# Patient Record
Sex: Male | Born: 1965 | Race: White | Hispanic: No | Marital: Married | Smoking: Never smoker
Health system: Southern US, Community
[De-identification: ages and names within clinical notes are randomized; demographics above are authoritative.]

## PROBLEM LIST (undated history)

## (undated) DIAGNOSIS — E119 Type 2 diabetes mellitus without complications: Secondary | ICD-10-CM

## (undated) HISTORY — PX: OTHER SURGICAL HISTORY: SHX169

## (undated) HISTORY — DX: Type 2 diabetes mellitus without complications: E11.9

## (undated) NOTE — *Deleted (*Deleted)
     Established patient visit   Patient: Roger Ruiz   DOB: 1965/05/10   47 y.o. Male  MRN: [id number] Visit Date: 01/03/2020  Today's healthcare provider: Megan Mans, MD   No chief complaint on file.  Subjective    HPI  Hyperlipidemia, unspecified hyperlipidemia type Resent Crestor 20 mg qd to pharmacy.  Return to clinic 2 months. - rosuvastatin (CRESTOR) 20 MG tablet; Take 1 tablet (20 mg total) by mouth daily.  Dispense: 90 tablet; Refill: 3  {Show patient history (optional):23778::" "}   Medications: Outpatient Medications Prior to Visit  Medication Sig  . cyclobenzaprine (FLEXERIL) 5 MG tablet One or two at bedtime as needed for calf spasms (Patient not taking: Reported on 07/11/2019)  . FARXIGA 10 MG TABS tablet Take 10 mg by mouth daily.  . Insulin Pen Needle (FIFTY50 PEN NEEDLES) 31G X 5 MM MISC Use as directed for up to 30 days  . meloxicam (MOBIC) 15 MG tablet Take 1 tablet (15 mg total) by mouth daily. (Patient not taking: Reported on 07/11/2019)  . metFORMIN (GLUCOPHAGE) 1000 MG tablet One tablet twice daily with meals  . ONETOUCH DELICA LANCETS 33G MISC   . ONETOUCH VERIO test strip   . rosuvastatin (CRESTOR) 20 MG tablet Take 1 tablet (20 mg total) by mouth daily. (Patient not taking: Reported on 11/09/2019)  . TRESIBA FLEXTOUCH 200 UNIT/ML SOPN    No facility-administered medications prior to visit.    Review of Systems  Constitutional: Negative for appetite change, chills and fever.  Respiratory: Negative for chest tightness, shortness of breath and wheezing.   Cardiovascular: Negative for chest pain and palpitations.  Gastrointestinal: Negative for abdominal pain, nausea and vomiting.    {Heme  Chem  Endocrine  Serology  Results Review (optional):23779::" "}  Objective    There were no vitals taken for this visit. {Show previous vital signs (optional):23777::" "}  Physical Exam  ***  No results found for any visits on 01/03/20.   Assessment & Plan     ***  No follow-ups on file.      {provider attestation***:1}   Megan Mans, MD  [facility] [phone] (phone) [phone] (fax)  [facility]

---

## 2010-01-27 ENCOUNTER — Emergency Department: Payer: Self-pay | Admitting: Emergency Medicine

## 2012-02-23 ENCOUNTER — Ambulatory Visit: Payer: Self-pay | Admitting: Family Medicine

## 2014-09-13 ENCOUNTER — Telehealth: Payer: Self-pay | Admitting: Family Medicine

## 2014-09-13 NOTE — Telephone Encounter (Signed)
Left message to call back. KW °

## 2014-09-13 NOTE — Telephone Encounter (Signed)
Pt is returning call for lab results.  CB#[phone]/MJ

## 2014-09-13 NOTE — Telephone Encounter (Signed)
Pt returning call for lab results.  CB#[phone]/MJ

## 2014-10-11 ENCOUNTER — Telehealth: Payer: Self-pay | Admitting: Family Medicine

## 2014-10-11 NOTE — Telephone Encounter (Signed)
Left message for wife to call back office

## 2014-10-11 NOTE — Telephone Encounter (Signed)
Pt wife, Rutha BouchardDevona called stating she thought pt was suppose to receive a Rx for Metformin.  She states when the a copy of the labs where picked up a note was put on the paper stating "Pharmacy info is needed".  She gave [facility] [address] as the pharmacy to call the Rx in if this is what pt needs to be taking/MJ

## 2014-10-25 ENCOUNTER — Ambulatory Visit (INDEPENDENT_AMBULATORY_CARE_PROVIDER_SITE_OTHER): Payer: BC Managed Care – PPO | Admitting: Family Medicine

## 2014-10-25 ENCOUNTER — Other Ambulatory Visit: Payer: Self-pay | Admitting: Family Medicine

## 2014-10-25 ENCOUNTER — Encounter: Payer: Self-pay | Admitting: Family Medicine

## 2014-10-25 VITALS — BP 108/70 | HR 100 | Temp 98.1°F | Resp 16 | Ht 71.0 in | Wt 181.0 lb

## 2014-10-25 DIAGNOSIS — N529 Male erectile dysfunction, unspecified: Secondary | ICD-10-CM

## 2014-10-25 DIAGNOSIS — M545 Low back pain, unspecified: Secondary | ICD-10-CM | POA: Insufficient documentation

## 2014-10-25 DIAGNOSIS — M25519 Pain in unspecified shoulder: Secondary | ICD-10-CM | POA: Insufficient documentation

## 2014-10-25 DIAGNOSIS — G8929 Other chronic pain: Secondary | ICD-10-CM | POA: Insufficient documentation

## 2014-10-25 DIAGNOSIS — E1169 Type 2 diabetes mellitus with other specified complication: Secondary | ICD-10-CM | POA: Insufficient documentation

## 2014-10-25 DIAGNOSIS — Z794 Long term (current) use of insulin: Secondary | ICD-10-CM | POA: Insufficient documentation

## 2014-10-25 DIAGNOSIS — E1165 Type 2 diabetes mellitus with hyperglycemia: Secondary | ICD-10-CM | POA: Insufficient documentation

## 2014-10-25 DIAGNOSIS — M25559 Pain in unspecified hip: Secondary | ICD-10-CM | POA: Insufficient documentation

## 2014-10-25 DIAGNOSIS — E041 Nontoxic single thyroid nodule: Secondary | ICD-10-CM | POA: Insufficient documentation

## 2014-10-25 DIAGNOSIS — R030 Elevated blood-pressure reading, without diagnosis of hypertension: Secondary | ICD-10-CM

## 2014-10-25 DIAGNOSIS — N521 Erectile dysfunction due to diseases classified elsewhere: Secondary | ICD-10-CM

## 2014-10-25 DIAGNOSIS — E119 Type 2 diabetes mellitus without complications: Secondary | ICD-10-CM | POA: Diagnosis not present

## 2014-10-25 DIAGNOSIS — IMO0001 Reserved for inherently not codable concepts without codable children: Secondary | ICD-10-CM | POA: Insufficient documentation

## 2014-10-25 DIAGNOSIS — E039 Hypothyroidism, unspecified: Secondary | ICD-10-CM

## 2014-10-25 DIAGNOSIS — E049 Nontoxic goiter, unspecified: Secondary | ICD-10-CM | POA: Insufficient documentation

## 2014-10-25 MED ORDER — METFORMIN HCL 500 MG PO TABS: 500.0000 mg | ORAL_TABLET | Freq: Two times a day (BID) | ORAL | Status: DC

## 2014-10-25 NOTE — Progress Notes (Signed)
Subjective:     Patient ID: Roger Ruiz, male   DOB: 11/04/1965, 49 y.o.   MRN: [id number]  HPI  Chief Complaint  Patient presents with  . Abnormal Lab    Patient had fasting labs drawn for his annual physical on 09/08/14. 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 He was lost to f/u.   Review of Systems  Constitutional:       Weight is increased today by 10#  Genitourinary:       Reports erectile dysfunction with minimal erection with attempted intercourse and no nocturnal erections. Also reports having trouble fully emptying his bladder.  Musculoskeletal: Positive for back pain (intermittent stiffness).  Skin:       Brief burning paresthesias of his extremties       Objective:   Physical Exam  Constitutional: He appears well-developed and well-nourished. No distress.       Assessment:    1. Type 2 diabetes mellitus without complication - metFORMIN (GLUCOPHAGE) 500 MG tablet; Take 1 tablet (500 mg total) by mouth 2 (two) times daily with a meal.  Dispense: 60 tablet; Refill: 2  2. Hypothyroidism, unspecified hypothyroidism type - T4, free - TSH  3. Failure of erection-samples of Viagra 50 mg. X 2 Provided. - PSA    Plan:    Return in 4 weeks. Reassured him we will be addressing all of his medical issues with the life threatening ones first.

## 2014-10-25 NOTE — Patient Instructions (Signed)
Start metformin. We will call you with lab results.

## 2014-10-26 LAB — PSA: Prostate Specific Ag, Serum: 0.6 ng/mL (ref 0.0–4.0)

## 2014-10-26 LAB — TSH: TSH: 0.491 u[IU]/mL (ref 0.450–4.500)

## 2014-10-26 LAB — T4, FREE: Free T4: 1.23 ng/dL (ref 0.82–1.77)

## 2014-10-30 ENCOUNTER — Telehealth: Payer: Self-pay

## 2014-10-30 NOTE — Telephone Encounter (Signed)
-----   Message from Anola Gurneyobert Chauvin, [place] sent at 10/30/2014  7:46 AM EDT ----- Thyroid and PSA ok.

## 2014-10-30 NOTE — Telephone Encounter (Signed)
Patient advised.

## 2014-11-22 ENCOUNTER — Encounter: Payer: Self-pay | Admitting: Family Medicine

## 2014-11-22 ENCOUNTER — Ambulatory Visit: Payer: BC Managed Care – PPO | Admitting: Family Medicine

## 2014-11-22 ENCOUNTER — Ambulatory Visit (INDEPENDENT_AMBULATORY_CARE_PROVIDER_SITE_OTHER): Payer: BC Managed Care – PPO | Admitting: Family Medicine

## 2014-11-22 VITALS — BP 108/84 | HR 82 | Temp 97.9°F | Resp 16 | Wt 179.4 lb

## 2014-11-22 DIAGNOSIS — E119 Type 2 diabetes mellitus without complications: Secondary | ICD-10-CM | POA: Diagnosis not present

## 2014-11-22 DIAGNOSIS — R1011 Right upper quadrant pain: Secondary | ICD-10-CM

## 2014-11-22 LAB — GLUCOSE, POCT (MANUAL RESULT ENTRY): POC Glucose: 324 mg/dl — AB (ref 70–99)

## 2014-11-22 MED ORDER — METFORMIN HCL 500 MG PO TABS: 1000.0000 mg | ORAL_TABLET | Freq: Two times a day (BID) | ORAL | Status: DC

## 2014-11-22 MED ORDER — METFORMIN HCL 1000 MG PO TABS: ORAL_TABLET | ORAL | Status: DC

## 2014-11-22 NOTE — Patient Instructions (Signed)
Start metformin 1000 mg. Twice daily.

## 2014-11-22 NOTE — Progress Notes (Signed)
Subjective:     Patient ID: Roger Ruiz, male   DOB: [birth year], 49 y.o.   MRN: [id number]  HPI  Chief Complaint  Patient presents with  . Diabetes    Patient comes in office today for follow up from 7/13. Patient was started on Metformin  BID, patient reports good compliance and tolerance of medication.  States he has had sweet tea today but has not eaten otherwise.   Review of Systems  Gastrointestinal: Positive for abdominal pain (developed RUQ abdominal tenderness/burning last week not related to meals. States he was bending over doing yardwork and feels he may have pulled a muscle. Reports it is getting better this week.).  Genitourinary:       Has not tried Viagra yet but wishes more samples       Objective:   Physical Exam  Constitutional: He appears well-developed and well-nourished. No distress.  Cardiovascular: Normal rate and regular rhythm.   Pulmonary/Chest: Breath sounds normal.  Abdominal: There is tenderness (right upper quadrant. Negative Murphy's sign).       Assessment:    1. Type 2 diabetes mellitus without complication - POCT Glucose (CBG) - metFORMIN (GLUCOPHAGE) 1000 MG tablet; One tablet twice daily with meals  Dispense: 60 tablet; Refill: 1  2. Right upper quadrant pain; ? Musculoskeletal      Plan:    Glucometer and rx for strips provided. Samples of Viagra 100 mg #2.Willl call for abdominal ultrasound if pain does not resolve. Anticipatory guidance regarding screening colonoscopy and labs at next visit.

## 2014-12-29 ENCOUNTER — Other Ambulatory Visit: Payer: Self-pay | Admitting: Family Medicine

## 2014-12-29 ENCOUNTER — Ambulatory Visit
Admission: RE | Admit: 2014-12-29 | Discharge: 2014-12-29 | Disposition: A | Discharge status: Home / Self Care | Payer: BC Managed Care – PPO | Source: Ambulatory Visit | Attending: Family Medicine | Admitting: Family Medicine

## 2014-12-29 DIAGNOSIS — R1011 Right upper quadrant pain: Secondary | ICD-10-CM | POA: Diagnosis present

## 2014-12-29 DIAGNOSIS — R109 Unspecified abdominal pain: Secondary | ICD-10-CM

## 2014-12-29 DIAGNOSIS — K760 Fatty (change of) liver, not elsewhere classified: Secondary | ICD-10-CM | POA: Insufficient documentation

## 2015-01-03 ENCOUNTER — Other Ambulatory Visit: Payer: Self-pay | Admitting: Gastroenterology

## 2015-01-03 DIAGNOSIS — R748 Abnormal levels of other serum enzymes: Secondary | ICD-10-CM

## 2015-01-03 DIAGNOSIS — R1011 Right upper quadrant pain: Secondary | ICD-10-CM

## 2015-01-09 ENCOUNTER — Ambulatory Visit
Admission: RE | Admit: 2015-01-09 | Discharge: 2015-01-09 | Disposition: A | Discharge status: Home / Self Care | Payer: BC Managed Care – PPO | Source: Ambulatory Visit | Attending: Gastroenterology | Admitting: Gastroenterology

## 2015-01-09 DIAGNOSIS — K760 Fatty (change of) liver, not elsewhere classified: Secondary | ICD-10-CM | POA: Diagnosis not present

## 2015-01-09 DIAGNOSIS — R748 Abnormal levels of other serum enzymes: Secondary | ICD-10-CM

## 2015-01-09 DIAGNOSIS — R1011 Right upper quadrant pain: Secondary | ICD-10-CM | POA: Insufficient documentation

## 2015-01-09 MED ORDER — IOHEXOL 300 MG/ML  SOLN
100.0000 mL | Freq: Once | INTRAMUSCULAR | Status: AC | PRN
  Administered 2015-01-09: 100 mL via INTRAVENOUS

## 2015-01-24 ENCOUNTER — Ambulatory Visit (INDEPENDENT_AMBULATORY_CARE_PROVIDER_SITE_OTHER): Payer: BC Managed Care – PPO | Admitting: Family Medicine

## 2015-01-24 ENCOUNTER — Encounter: Payer: Self-pay | Admitting: Family Medicine

## 2015-01-24 VITALS — BP 116/76 | HR 91 | Temp 98.3°F | Resp 16 | Wt 179.0 lb

## 2015-01-24 DIAGNOSIS — E119 Type 2 diabetes mellitus without complications: Secondary | ICD-10-CM

## 2015-01-24 DIAGNOSIS — Z23 Encounter for immunization: Secondary | ICD-10-CM

## 2015-01-24 DIAGNOSIS — K760 Fatty (change of) liver, not elsewhere classified: Secondary | ICD-10-CM | POA: Insufficient documentation

## 2015-01-24 DIAGNOSIS — E1169 Type 2 diabetes mellitus with other specified complication: Secondary | ICD-10-CM

## 2015-01-24 DIAGNOSIS — N521 Erectile dysfunction due to diseases classified elsewhere: Secondary | ICD-10-CM | POA: Diagnosis not present

## 2015-01-24 LAB — POCT GLYCOSYLATED HEMOGLOBIN (HGB A1C): Hemoglobin A1C: 11.3

## 2015-01-24 MED ORDER — VARDENAFIL HCL 10 MG PO TABS: ORAL_TABLET | ORAL | Status: DC

## 2015-01-24 MED ORDER — METFORMIN HCL 1000 MG PO TABS: ORAL_TABLET | ORAL | Status: AC

## 2015-01-24 NOTE — Progress Notes (Signed)
Subjective:     Patient ID: Roger Ruiz, male   DOB: 10/20/1965, 49 y.o.   MRN: [id number]  HPI  Chief Complaint  Patient presents with  . Diabetes    Patient is present in office today for diabetes follow up. Last office visit was 08/10, in house glucose at visit was 324 patient was advised to continue Metfomrin 1000mg . Patient states that most recent glucose was 206 after eating, patient reports that he has cutt out carbonated beverages. Patient reports good compliance, tolerance and symptom control; patient denies any hypoglycemia incidents. Patient is inspecting feet for wounds and sore but he has noticed a discoloration of great toe on both feet.  States he had a negative [facility]up of RUQ [facility]. He was off metformin for a while during the studies. Recommended he return to endocrinologist for evaluation of his diabetes.Reports as above he has made dietary changes eating less potatoes and switched to diet sodas.   Review of Systems  Respiratory: Negative for shortness of breath.   Cardiovascular: Negative for chest pain and palpitations.  Genitourinary:       States Viagra 100 mg. Did not work for him and he wishes to try a different medication.       Objective:   Physical Exam  Constitutional: He appears well-developed and well-nourished. No distress.  Lungs: clear Heart: RRR without murmur Lower extremities: no edema; pedal pulses intact, sensation to monofilament intact, no wounds noted.     Assessment:    1. Type 2 diabetes mellitus without complication, without long-term current use of insulin (HCC): A1C results represent one month on lower dose of metformin - POCT HgB A1C - Ambulatory referral to Endocrinology - metFORMIN (GLUCOPHAGE) 1000 MG tablet; One tablet twice daily with meals  Dispense: 60 tablet; Refill: 2  2. Need for influenza vaccination  3. Erectile dysfunction associated with type 2 diabetes mellitus (HCC) - vardenafil (LEVITRA) 10 MG  tablet; Take one or two as needed daily  Dispense: 10 tablet; Refill: 0    Plan:    f/u with endocrinology.

## 2015-01-24 NOTE — Patient Instructions (Signed)
Continue lifestyle changes and metformin as you are doing and follow up with endocrine

## 2015-08-05 ENCOUNTER — Encounter: Payer: Self-pay | Admitting: Emergency Medicine

## 2015-08-05 ENCOUNTER — Emergency Department
Admission: EM | Admit: 2015-08-05 | Discharge: 2015-08-05 | Disposition: A | Discharge status: Home / Self Care | Payer: BC Managed Care – PPO | Attending: Emergency Medicine | Admitting: Emergency Medicine

## 2015-08-05 DIAGNOSIS — R112 Nausea with vomiting, unspecified: Secondary | ICD-10-CM | POA: Insufficient documentation

## 2015-08-05 DIAGNOSIS — Z7984 Long term (current) use of oral hypoglycemic drugs: Secondary | ICD-10-CM | POA: Diagnosis not present

## 2015-08-05 DIAGNOSIS — Z79899 Other long term (current) drug therapy: Secondary | ICD-10-CM | POA: Diagnosis not present

## 2015-08-05 DIAGNOSIS — E119 Type 2 diabetes mellitus without complications: Secondary | ICD-10-CM | POA: Insufficient documentation

## 2015-08-05 DIAGNOSIS — R1012 Left upper quadrant pain: Secondary | ICD-10-CM | POA: Diagnosis present

## 2015-08-05 LAB — COMPREHENSIVE METABOLIC PANEL
ALT: 15 U/L — ABNORMAL LOW (ref 17–63)
AST: 19 U/L (ref 15–41)
Albumin: 4.5 g/dL (ref 3.5–5.0)
Alkaline Phosphatase: 51 U/L (ref 38–126)
Anion gap: 13 (ref 5–15)
BUN: 20 mg/dL (ref 6–20)
CO2: 23 mmol/L (ref 22–32)
Calcium: 9 mg/dL (ref 8.9–10.3)
Chloride: 104 mmol/L (ref 101–111)
Creatinine, Ser: 0.92 mg/dL (ref 0.61–1.24)
GFR calc Af Amer: >60 mL/min (ref >60)
GFR calc non Af Amer: >60 mL/min (ref >60)
Glucose, Bld: 211 mg/dL — ABNORMAL HIGH (ref 65–99)
Potassium: 3.7 mmol/L (ref 3.5–5.1)
Sodium: 140 mmol/L (ref 135–145)
Total Bilirubin: 1.3 mg/dL — ABNORMAL HIGH (ref 0.3–1.2)
Total Protein: 7.6 g/dL (ref 6.5–8.1)

## 2015-08-05 LAB — FIBRIN DERIVATIVES D-DIMER (ARMC ONLY): Fibrin derivatives D-dimer (ARMC): 252 (ref 0–499)

## 2015-08-05 LAB — CBC
HCT: 49.6 % (ref 40.0–52.0)
Hemoglobin: 16.9 g/dL (ref 13.0–18.0)
MCH: 29.8 pg (ref 26.0–34.0)
MCHC: 34.2 g/dL (ref 32.0–36.0)
MCV: 87.3 fL (ref 80.0–100.0)
Platelets: 222 10*3/uL (ref 150–440)
RBC: 5.68 MIL/uL (ref 4.40–5.90)
RDW: 13.2 % (ref 11.5–14.5)
WBC: 14.2 10*3/uL — ABNORMAL HIGH (ref 3.8–10.6)

## 2015-08-05 LAB — URINALYSIS COMPLETE WITH MICROSCOPIC (ARMC ONLY)
Bacteria, UA: NONE SEEN
Bilirubin Urine: NEGATIVE
Glucose, UA: >500 mg/dL — AB
Hgb urine dipstick: NEGATIVE
Leukocytes, UA: NEGATIVE
Nitrite: NEGATIVE
Protein, ur: NEGATIVE mg/dL
RBC / HPF: NONE SEEN RBC/hpf (ref 0–5)
Specific Gravity, Urine: 1.03 (ref 1.005–1.030)
Squamous Epithelial / LPF: NONE SEEN
WBC, UA: NONE SEEN WBC/hpf (ref 0–5)
pH: 5 (ref 5.0–8.0)

## 2015-08-05 LAB — TROPONIN I: Troponin I: <0.03 ng/mL (ref <0.031)

## 2015-08-05 LAB — LIPASE, BLOOD: Lipase: 65 U/L — ABNORMAL HIGH (ref 11–51)

## 2015-08-05 MED ORDER — SODIUM CHLORIDE 0.9 % IV BOLUS (SEPSIS)
1000.0000 mL | Freq: Once | INTRAVENOUS | Status: AC
  Administered 2015-08-05: 1000 mL via INTRAVENOUS

## 2015-08-05 MED ORDER — RANITIDINE HCL 75 MG PO TABS: 75.0000 mg | ORAL_TABLET | Freq: Two times a day (BID) | ORAL | Status: DC

## 2015-08-05 MED ORDER — ONDANSETRON HCL 4 MG PO TABS: 4.0000 mg | ORAL_TABLET | Freq: Every day | ORAL | Status: DC | PRN

## 2015-08-05 MED ORDER — ONDANSETRON HCL 4 MG/2ML IJ SOLN
4.0000 mg | Freq: Once | INTRAMUSCULAR | Status: AC | PRN
  Administered 2015-08-05: 4 mg via INTRAVENOUS
  Filled 2015-08-05: qty 2

## 2015-08-05 NOTE — ED Notes (Signed)
Patient with left side abd pain, nausea and vomiting that started about 21:00 tonight.

## 2015-08-05 NOTE — Discharge Instructions (Signed)
Abdominal Pain, Adult °Many things can cause abdominal pain. Usually, abdominal pain is not caused by a disease and will improve without treatment. It can often be observed and treated at home. Your health care provider will do a physical exam and possibly order blood tests and X-rays to help determine the seriousness of your pain. However, in many cases, more time must pass before a clear cause of the pain can be found. Before that point, your health care provider may not know if you need more testing or further treatment. °HOME CARE INSTRUCTIONS °Monitor your abdominal pain for any changes. The following actions may help to alleviate any discomfort you are experiencing: °· Only take over-the-counter or prescription medicines as directed by your health care provider. °· Do not take laxatives unless directed to do so by your health care provider. °· Try a clear liquid diet (broth, tea, or water) as directed by your health care provider. Slowly move to a bland diet as tolerated. °SEEK MEDICAL CARE IF: °· You have unexplained abdominal pain. °· You have abdominal pain associated with nausea or diarrhea. °· You have pain when you urinate or have a bowel movement. °· You experience abdominal pain that wakes you in the night. °· You have abdominal pain that is worsened or improved by eating food. °· You have abdominal pain that is worsened with eating fatty foods. °· You have a fever. °SEEK IMMEDIATE MEDICAL CARE IF: °· Your pain does not go away within 2 hours. °· You keep throwing up (vomiting). °· Your pain is felt only in portions of the abdomen, such as the right side or the left lower portion of the abdomen. °· You pass bloody or black tarry stools. °MAKE SURE YOU: °· Understand these instructions. °· Will watch your condition. °· Will get help right away if you are not doing well or get worse. °  °This information is not intended to replace advice given to you by your health care provider. Make sure you discuss  any questions you have with your health care provider. °  °Document Released: 01/08/2005 Document Revised: 12/20/2014 Document Reviewed: 12/08/2012 °Elsevier Interactive Patient Education ©2016 [employer] ° °Nausea and Vomiting °Nausea means you feel sick to your stomach. Throwing up (vomiting) is a reflex where stomach contents come out of your mouth. °HOME CARE  °· Take medicine as told by your doctor. °· Do not force yourself to eat. However, you do need to drink fluids. °· If you feel like eating, eat a normal diet as told by your doctor. °¨ Eat rice, wheat, potatoes, bread, lean meats, yogurt, fruits, and vegetables. °¨ Avoid high-fat foods. °· Drink enough fluids to keep your pee (urine) clear or pale yellow. °· Ask your doctor how to replace body fluid losses (rehydrate). Signs of body fluid loss (dehydration) include: °¨ Feeling very thirsty. °¨ Dry lips and mouth. °¨ Feeling dizzy. °¨ Dark pee. °¨ Peeing less than normal. °¨ Feeling confused. °¨ Fast breathing or heart rate. °GET HELP RIGHT AWAY IF:  °· You have blood in your throw up. °· You have black or bloody poop (stool). °· You have a bad headache or stiff neck. °· You feel confused. °· You have bad belly (abdominal) pain. °· You have chest pain or trouble breathing. °· You do not pee at least once every 8 hours. °· You have cold, clammy skin. °· You keep throwing up after 24 to 48 hours. °· You have a fever. °MAKE SURE YOU:  °·   Understand these instructions. °· Will watch your condition. °· Will get help right away if you are not doing well or get worse. °  °This information is not intended to replace advice given to you by your health care provider. Make sure you discuss any questions you have with your health care provider. °  °Document Released: 09/17/2007 Document Revised: 06/23/2011 Document Reviewed: 08/30/2010 °Elsevier Interactive Patient Education ©2016 Elsevier Inc[employer] ° °

## 2015-08-05 NOTE — ED Provider Notes (Signed)
[facility] Emergency Department Provider Note  ____________________________________________  Time seen: Approximately 730 AM  I have reviewed the triage vital signs and the nursing notes.   HISTORY  Chief Complaint Abdominal Pain; Nausea; and Emesis   HPI Roger Ruiz is a 50 y.o. male with a history of diabetes who is presenting to the emergency department today with left upper quadrant, sharp stabbing pain over the past one half weeks. He said that he also had an episode of vomiting last night and one episode of diarrhea this past Friday. He denies any blood in his vomit or diarrhea. Denies any known sick contacts. Says the pain is sharp and stabbing last week with associated with cough to his left upper quadrant. He points to his left lower thorax when he describes the area of pain. He also says that he had a similar episode this past fall when he had a CAT scan that did not reveal any acute pathology. He denies any pain or nausea at this time. Says that his heart does race when he gets sick.    Past Medical History  Diagnosis Date  . Diabetes mellitus without complication [facility])     Patient Active Problem List   Diagnosis Date Noted  . NAFLD (nonalcoholic fatty liver disease) 16/01/9603  . Arthralgia of hip 10/25/2014  . Chronic LBP 10/25/2014  . Diabetes (HCC) 10/25/2014  . Erectile dysfunction associated with type 2 diabetes mellitus (HCC) 10/25/2014  . Goiter, non-toxic 10/25/2014    Past Surgical History  Procedure Laterality Date  . No surgeries      Current Outpatient Rx  Name  Route  Sig  Dispense  Refill  . dextromethorphan-guaiFENesin (MUCINEX DM) 30-600 MG 12hr tablet   Oral   Take by mouth.         . fluticasone (FLONASE) 50 MCG/ACT nasal spray   Nasal   Place into the nose.         . metFORMIN (GLUCOPHAGE) 1000 MG tablet      One tablet twice daily with meals   60 tablet   2   . vardenafil (LEVITRA) 10 MG tablet       Take one or two as needed daily   10 tablet   0     Allergies Review of patient's allergies indicates no known allergies.  Family History  Problem Relation Age of Onset  . Diverticulitis Mother   . Hypertension Father     Social History Social History  Substance Use Topics  . Smoking status: Never Smoker   . Smokeless tobacco: None  . Alcohol Use: No    Review of Systems Constitutional: No fever/chills Eyes: No visual changes. ENT: No sore throat. Cardiovascular: Denies chest pain. Respiratory: Denies shortness of breath. Gastrointestinal:  No constipation. Genitourinary: Negative for dysuria. Musculoskeletal: Negative for back pain. Skin: Negative for rash. Neurological: Negative for headaches, focal weakness or numbness.  10-point ROS otherwise negative.  ____________________________________________   PHYSICAL EXAM:  VITAL SIGNS: ED Triage Vitals  Enc Vitals Group     BP 08/05/15 0154 106/72 mmHg     Pulse Rate 08/05/15 0154 121     Resp 08/05/15 0154 18     Temp 08/05/15 0154 98.2 F (36.8 C)     Temp Source 08/05/15 0154 Oral     SpO2 08/05/15 0154 99 %     Weight 08/05/15 0154 170 lb (77.111 kg)     Height 08/05/15 0154  (1.753 m)  Head Cir --      Peak Flow --      Pain Score --      Pain Loc --      Pain Edu? --      Excl. in GC? --     Constitutional: Alert and oriented. Well appearing and in no acute distress. Eyes: Conjunctivae are normal. PERRL. EOMI. Head: Atraumatic. Nose: No congestion/rhinnorhea. Mouth/Throat: Mucous membranes are moist.   Neck: No stridor.   Cardiovascular:Tachycardic, regular rhythm. Grossly normal heart sounds.   Respiratory: Normal respiratory effort.  No retractions. Lungs CTAB. Gastrointestinal: Soft and nontender. No distention. No CVA tenderness. Musculoskeletal: No lower extremity tenderness nor edema.  No joint effusions. Neurologic:  Normal speech and language. No gross focal neurologic  deficits are appreciated.  Skin:  Skin is warm, dry and intact. No rash noted. Psychiatric: Mood and affect are normal. Speech and behavior are normal.  ____________________________________________   LABS (all labs ordered are listed, but only abnormal results are displayed)  Labs Reviewed  LIPASE, BLOOD - Abnormal; Notable for the following:    Lipase 65 (*)    All other components within normal limits  COMPREHENSIVE METABOLIC PANEL - Abnormal; Notable for the following:    Glucose, Bld 211 (*)    ALT 15 (*)    Total Bilirubin 1.3 (*)    All other components within normal limits  CBC - Abnormal; Notable for the following:    WBC 14.2 (*)    All other components within normal limits  URINALYSIS COMPLETEWITH MICROSCOPIC ([facility] ONLY) - Abnormal; Notable for the following:    Color, Urine YELLOW (*)    APPearance CLEAR (*)    Glucose, UA >500 (*)    Ketones, ur 2+ (*)    All other components within normal limits  FIBRIN DERIVATIVES D-DIMER ([facility] ONLY)  TROPONIN I   ____________________________________________  EKG  ED ECG REPORT I, Arelia LongestSchaevitz,  Charleen Madera M, the attending physician, personally viewed and interpreted this ECG.   Date: 08/05/2015  EKG Time: 732  Rate: 102  Rhythm: sinus tachycardia  Axis: Normal axis  Intervals:none  ST&T Change: No ST segment elevation or depression. No abnormal T-wave inversion. Machine read as ST elevation in inferior leads. However, I believe this is more due to the morphology of the patient's T waves/ST segments.  ____________________________________________  RADIOLOGY  ____________________________________________   PROCEDURES  ____________________________________________   INITIAL IMPRESSION / ASSESSMENT AND PLAN / ED COURSE  Pertinent labs & imaging results that were available during my care of the patient were reviewed by me and considered in my medical decision making (see chart for  details).  ----------------------------------------- 10:15 AM on 08/05/2015 -----------------------------------------  Patient still tachycardic with a heart rate of 102 but says "I feel fine." He says that his heart rate jumps when he feel sick. He is denying any nausea or pain at this time. I also repeated the abdominal examination he is completely nontender. Nondistended. Possibly viral etiology. The patient does have 2+ ketones in his urine but that may be from dehydration. He is not gapped and his bicarbonate is 23. Frail likely to be DKA. I did discuss with the patient strict return precautions to come back to the emergency department for any worsening symptoms, especially nausea vomiting abdominal pain. He is understanding the plan was to comply. I will discharge him with Zofran as well as an antacid. He'll be following up with his primary care doctor. ____________________________________________   FINAL CLINICAL IMPRESSION(S) / ED  DIAGNOSES  Left upper quadrant abdominal pain. Nausea and vomiting.    Myrna Blazer, MD 08/05/15 1017

## 2015-08-28 ENCOUNTER — Encounter: Payer: Self-pay | Admitting: Family Medicine

## 2015-08-28 ENCOUNTER — Ambulatory Visit (INDEPENDENT_AMBULATORY_CARE_PROVIDER_SITE_OTHER): Payer: BC Managed Care – PPO | Admitting: Family Medicine

## 2015-08-28 VITALS — BP 134/90 | HR 98 | Temp 97.5°F | Resp 16 | Wt 175.0 lb

## 2015-08-28 DIAGNOSIS — R202 Paresthesia of skin: Secondary | ICD-10-CM

## 2015-08-28 DIAGNOSIS — R52 Pain, unspecified: Secondary | ICD-10-CM | POA: Diagnosis not present

## 2015-08-28 MED ORDER — DOXYCYCLINE HYCLATE 100 MG PO TABS: 100.0000 mg | ORAL_TABLET | Freq: Two times a day (BID) | ORAL | Status: DC

## 2015-08-28 MED ORDER — HYDROCODONE-ACETAMINOPHEN 5-325 MG PO TABS: ORAL_TABLET | ORAL | Status: DC

## 2015-08-28 NOTE — Patient Instructions (Signed)
We will call you with the lab results. 

## 2015-08-28 NOTE — Progress Notes (Addendum)
Subjective:     Patient ID: Roger Ruiz, male   DOB: 1965/09/07, 50 y.o.   MRN: [id number]  HPI  Chief Complaint  Patient presents with  . Numbness    Patient comes in office today with concerns of body aches, burning sensation of the skin, insomnia and numbness of fingers and right elbow for the past 48 hrs. Patient reports he has a history of diabetes and was last seen by Endocrinologist in April. Blood sugar readings at home have been within normal limits, most recent reading was 119.  States he does spend time outside and has seen a tick crawling on him on at least one occasion. States body aches and burning sensations are severe enough to keep him from sleeping. Has been taking ibuprofen and Tylenol with little improvement. Reports additional symptoms of sweats but has not documented a fever at home. No cold or respiratory symptoms reported. Accompanied by his wife today.   Review of Systems     Objective:   Physical Exam  Constitutional: He appears well-developed and well-nourished. No distress.  Cardiovascular: Normal rate and regular rhythm.   Pulmonary/Chest: Breath sounds normal.  Musculoskeletal:  Muscle strength in lower extremities 5/5. SLR's to 90 degrees without back pain or radiation of pain. No discomfort when changing positions.       Assessment:    1. Body aches: will cover for tick fever - Comprehensive metabolic panel - Sedimentation rate - CBC with Differential/Platelet - Rocky mtn spotted fvr abs pnl(IgG+IgM) - Lyme Disease, IgM, Early Test w/ Rflx - doxycycline (VIBRA-TABS) 100 MG tablet; Take 1 tablet (100 mg total) by mouth 2 (two) times daily.  Dispense: 14 tablet; Refill: 1 - HYDROcodone-acetaminophen (NORCO/VICODIN) 5-325 MG tablet; One every 4-6 hours as needed for pain  Dispense: 28 tablet; Refill: 0  2. Paresthesias - Comprehensive metabolic panel - Sedimentation rate - CBC with Differential/Platelet - Rocky mtn spotted fvr abs pnl(IgG+IgM) -  Lyme Disease, IgM, Early Test w/ Rflx - doxycycline (VIBRA-TABS) 100 MG tablet; Take 1 tablet (100 mg total) by mouth 2 (two) times daily.  Dispense: 14 tablet; Refill: 1    Plan:    Further f/u pending lab work. Work excuse for 5/16-5/19.

## 2015-08-29 ENCOUNTER — Telehealth: Payer: Self-pay

## 2015-08-29 NOTE — Telephone Encounter (Signed)
-----   Message from Anola Gurneyobert Chauvin, [place] sent at 08/29/2015  9:48 AM EDT ----- Regarding: labs Please add acute hepatitis panel, CMV (cytomegalovirus), and EBV (mono) antibodies to his current blood draw. His labs are partially in and reflect significant liver inflammation. I will call the patient when the CBC is available.

## 2015-08-29 NOTE — Telephone Encounter (Signed)
additional labs have been ordered

## 2015-08-30 LAB — CBC WITH DIFFERENTIAL/PLATELET
Basophils Absolute: 0 10*3/uL (ref 0.0–0.2)
Basos: 0 %
EOS (ABSOLUTE): 0.1 10*3/uL (ref 0.0–0.4)
Eos: 1 %
Hematocrit: 50.2 % (ref 37.5–51.0)
Hemoglobin: 16.8 g/dL (ref 12.6–17.7)
Immature Grans (Abs): 0 10*3/uL (ref 0.0–0.1)
Immature Granulocytes: 0 %
Lymphocytes Absolute: 2.1 10*3/uL (ref 0.7–3.1)
Lymphs: 30 %
MCH: 29.9 pg (ref 26.6–33.0)
MCHC: 33.5 g/dL (ref 31.5–35.7)
MCV: 89 fL (ref 79–97)
Monocytes Absolute: 0.6 10*3/uL (ref 0.1–0.9)
Monocytes: 8 %
Neutrophils Absolute: 4.2 10*3/uL (ref 1.4–7.0)
Neutrophils: 61 %
Platelets: 159 10*3/uL (ref 150–379)
RBC: 5.62 x10E6/uL (ref 4.14–5.80)
RDW: 13.5 % (ref 12.3–15.4)
WBC: 7 10*3/uL (ref 3.4–10.8)

## 2015-08-30 LAB — COMPREHENSIVE METABOLIC PANEL
ALT: 1027 IU/L (ref 0–44)
AST: 334 IU/L — ABNORMAL HIGH (ref 0–40)
Albumin/Globulin Ratio: 1.4 (ref 1.2–2.2)
Albumin: 4.2 g/dL (ref 3.5–5.5)
Alkaline Phosphatase: 233 IU/L — ABNORMAL HIGH (ref 39–117)
BUN/Creatinine Ratio: 12 (ref 9–20)
BUN: 10 mg/dL (ref 6–24)
Bilirubin Total: 1 mg/dL (ref 0.0–1.2)
CO2: 20 mmol/L (ref 18–29)
Calcium: 9.3 mg/dL (ref 8.7–10.2)
Chloride: 97 mmol/L (ref 96–106)
Creatinine, Ser: 0.83 mg/dL (ref 0.76–1.27)
GFR calc Af Amer: 119 mL/min/{1.73_m2} (ref >59)
GFR calc non Af Amer: 103 mL/min/{1.73_m2} (ref >59)
Globulin, Total: 3 g/dL (ref 1.5–4.5)
Glucose: 214 mg/dL — ABNORMAL HIGH (ref 65–99)
Potassium: 4.5 mmol/L (ref 3.5–5.2)
Sodium: 137 mmol/L (ref 134–144)
Total Protein: 7.2 g/dL (ref 6.0–8.5)

## 2015-08-30 LAB — RMSF, IGG, IFA: RMSF, IGG, IFA: <1:64 {titer}

## 2015-08-30 LAB — ROCKY MTN SPOTTED FVR ABS PNL(IGG+IGM)
RMSF IgG: POSITIVE — AB
RMSF IgM: 0.26 index (ref 0.00–0.89)

## 2015-08-30 LAB — SEDIMENTATION RATE: Sed Rate: 2 mm/hr (ref 0–30)

## 2015-08-30 LAB — LYME, IGM, EARLY TEST/REFLEX: LYME DISEASE AB, QUANT, IGM: <0.8 index (ref 0.00–0.79)

## 2015-09-01 LAB — EPSTEIN-BARR VIRUS VCA ANTIBODY PANEL
EBV Early Antigen Ab, IgG: <9 U/mL (ref 0.0–8.9)
EBV NA IgG: >600 U/mL — ABNORMAL HIGH (ref 0.0–17.9)
EBV VCA IgG: >600 U/mL — ABNORMAL HIGH (ref 0.0–17.9)
EBV VCA IgM: <36 U/mL (ref 0.0–35.9)

## 2015-09-01 LAB — HEPATITIS PANEL, ACUTE
Hep A IgM: NEGATIVE
Hep B C IgM: NEGATIVE
Hep C Virus Ab: 0.1 s/co ratio (ref 0.0–0.9)
Hepatitis B Surface Ag: NEGATIVE

## 2015-09-01 LAB — SPECIMEN STATUS REPORT

## 2015-09-01 LAB — CMV IGM: CMV IgM Ser EIA-aCnc: <30 AU/mL (ref 0.0–29.9)

## 2015-09-04 ENCOUNTER — Ambulatory Visit (INDEPENDENT_AMBULATORY_CARE_PROVIDER_SITE_OTHER): Payer: BC Managed Care – PPO | Admitting: Family Medicine

## 2015-09-04 ENCOUNTER — Encounter: Payer: Self-pay | Admitting: Family Medicine

## 2015-09-04 VITALS — BP 120/84 | HR 70 | Temp 97.9°F | Resp 16 | Wt 175.8 lb

## 2015-09-04 DIAGNOSIS — R945 Abnormal results of liver function studies: Principal | ICD-10-CM

## 2015-09-04 DIAGNOSIS — M25511 Pain in right shoulder: Secondary | ICD-10-CM

## 2015-09-04 DIAGNOSIS — G8929 Other chronic pain: Secondary | ICD-10-CM | POA: Diagnosis not present

## 2015-09-04 DIAGNOSIS — R7989 Other specified abnormal findings of blood chemistry: Secondary | ICD-10-CM | POA: Diagnosis not present

## 2015-09-04 NOTE — Patient Instructions (Addendum)
We will call you with the lab results. Discussed use of Benadryl to help sleep.

## 2015-09-04 NOTE — Progress Notes (Signed)
Subjective:     Patient ID: Roger Ruiz, male   DOB: 1965/10/11, 50 y.o.   MRN: [id number]  HPI  Chief Complaint  Patient presents with  . Follow-up    Patient returns back to office today to follow up visit. Patient was last seen in office 08/28/15 and treated with antibiotic to cover tick fever.  Patient reports that he is on his second round of doycycline and he is still having pain causing him difficulty to sleep at night. Patient reports that there is numbness on the back of his right arm and believes that he might have some damage.   . Abnormal Lab    Patient returns to address recent lab report showing elevated LFT  Reports in general feels better but is having pain from old injury in his right shoulder/scapula which is interrupting his sleep. Acute hepatitis screen, CMV, and EBV antibodies were ok.   Review of Systems  Gastrointestinal: Negative for nausea, vomiting and abdominal pain.       Objective:   Physical Exam  Constitutional: He appears well-developed and well-nourished. No distress.  Abdominal: Soft. There is no tenderness. Guarding: vountary due to ticklish.  No hepatomegaly     Assessment:    1. Elevated liver function tests: associated with presumed tick fever  - Hepatic function panel    Plan:    Further f/u pending lab results.

## 2015-09-05 ENCOUNTER — Telehealth: Payer: Self-pay

## 2015-09-05 LAB — HEPATIC FUNCTION PANEL
ALT: 84 IU/L — ABNORMAL HIGH (ref 0–44)
AST: 24 IU/L (ref 0–40)
Albumin: 4.3 g/dL (ref 3.5–5.5)
Alkaline Phosphatase: 125 IU/L — ABNORMAL HIGH (ref 39–117)
Bilirubin Total: 0.6 mg/dL (ref 0.0–1.2)
Bilirubin, Direct: 0.2 mg/dL (ref 0.00–0.40)
Total Protein: 7.1 g/dL (ref 6.0–8.5)

## 2015-09-05 NOTE — Telephone Encounter (Signed)
-----   Message from Anola Gurneyobert Chauvin, [place] sent at 09/05/2015  8:41 AM EDT ----- Liver tests are dramatically improved and near normal. Complete doxycycline as we discussed. Did you want to see the orthopedic doctor (Dr. Martha ClanKrasinski) again about your shoulder?p

## 2015-09-05 NOTE — Telephone Encounter (Signed)
Patient has been advised he states that pain in shoulder has improved, he states that he will call back if pain pain persist. KW

## 2015-11-23 ENCOUNTER — Ambulatory Visit (INDEPENDENT_AMBULATORY_CARE_PROVIDER_SITE_OTHER): Payer: BC Managed Care – PPO | Admitting: Family Medicine

## 2015-11-23 ENCOUNTER — Encounter: Payer: Self-pay | Admitting: Family Medicine

## 2015-11-23 VITALS — BP 116/82 | HR 74 | Temp 98.3°F | Resp 16 | Wt 181.0 lb

## 2015-11-23 DIAGNOSIS — R202 Paresthesia of skin: Secondary | ICD-10-CM

## 2015-11-23 DIAGNOSIS — M7062 Trochanteric bursitis, left hip: Secondary | ICD-10-CM | POA: Diagnosis not present

## 2015-11-23 DIAGNOSIS — M79662 Pain in left lower leg: Secondary | ICD-10-CM | POA: Diagnosis not present

## 2015-11-23 MED ORDER — MELOXICAM 15 MG PO TABS: 15.0000 mg | ORAL_TABLET | Freq: Every day | ORAL | 0 refills | Status: DC

## 2015-11-23 MED ORDER — CYCLOBENZAPRINE HCL 5 MG PO TABS: ORAL_TABLET | ORAL | 0 refills | Status: AC

## 2015-11-23 NOTE — Progress Notes (Signed)
Subjective:     Patient ID: Roger Ruiz, male   DOB: Jul 22, 1965, 50 y.o.   MRN: [id number]  HPI  Chief Complaint  Patient presents with  . Leg Pain    Patient comes in office today with concerns of leg pain on the left side for the past several weeks. Patient reports that around his hip skin is sensitive to the touch, and pain in lower leg is described as a ache.  . Numbness    Patient would like to address numbness and tingling in his forearm radiating down to his fingers intermittent since May  States numbness and tingling are residual from the time he had presumed tick fever treated  in the Spring. Has developed left lateral hip tenderness and a throbbing pain in his left calf particularly at night. No specific injury reported though he does work in his yard. Continues to be followed by endocrine for diabetes and not sure his sugar is controlled well yet.   Review of Systems     Objective:   Physical Exam  Constitutional: He appears well-developed and well-nourished. No distress.  Cardiovascular:  Pulses:      Dorsalis pedis pulses are 2+ on the left side.       Posterior tibial pulses are 2+ on the left side.  Musculoskeletal: He exhibits no edema (left lower extremity).  Mild left calf tenderness just lateral to his tibia but no erythema/rash. Tender over his left trochanteric bursa. SLR's to 90 degrees without discomfort.       Assessment:    1. Trochanteric bursitis of left hip - meloxicam (MOBIC) 15 MG tablet; Take 1 tablet (15 mg total) by mouth daily.  Dispense: 30 tablet; Refill: 0  2. Calf pain, left - meloxicam (MOBIC) 15 MG tablet; Take 1 tablet (15 mg total) by mouth daily.  Dispense: 30 tablet; Refill: 0  3. Paresthesias in right hand/arm; ? Secondary to diabetes ? Post lyme syndrome    Plan:    Consider orthopedic consult.

## 2015-11-23 NOTE — Patient Instructions (Signed)
Let me know if you are not getting better after a week.

## 2016-03-17 IMAGING — CT CT ABDOMEN W/ CM
2 of 5 series · 16 of 46 positions shown, 18 images · IV contrast (omnipaque)
Comparison: None.

CLINICAL DATA: Right upper quadrant abdominal pain for 3 months.
Elevated serum lipase.

EXAM:
CT ABDOMEN WITH CONTRAST
TECHNIQUE: Multidetector CT imaging of the abdomen was performed using the
standard protocol following bolus administration of intravenous
contrast.
CONTRAST:  100mL OMNIPAQUE IOHEXOL 300 MG/ML  SOLN

[Series 4: routine w · axial · 0.71mm/px · z∈[-906,-676]mm · 13 of 54 slices shown, 15 images]
[im 4/54  soft-tissue]
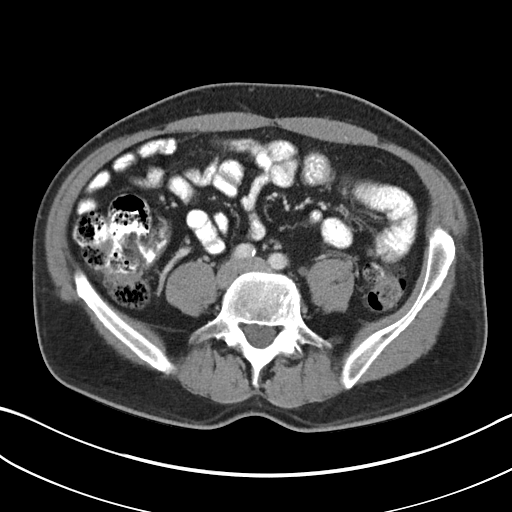
[im 4/54  bone]
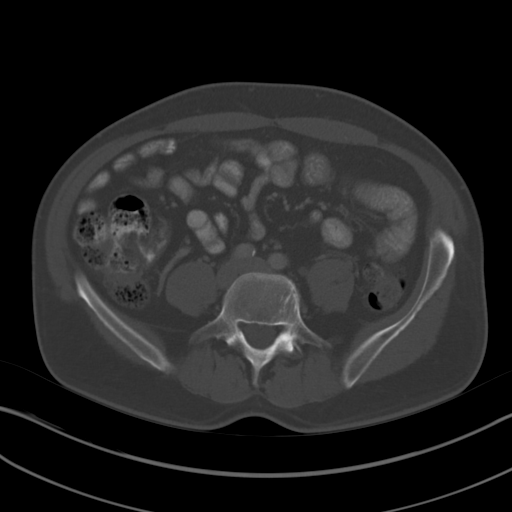
[im 8/54  soft-tissue]
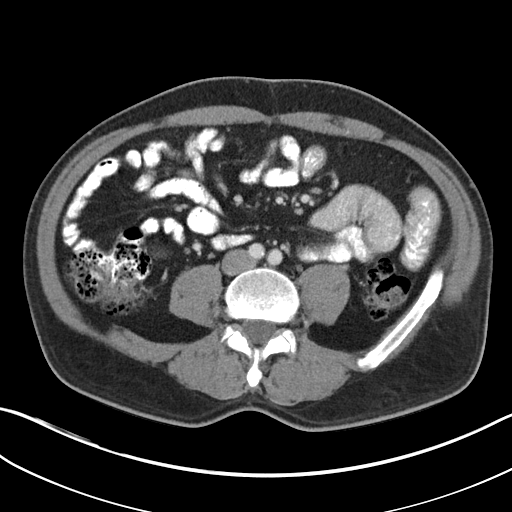
[im 12/54  soft-tissue]
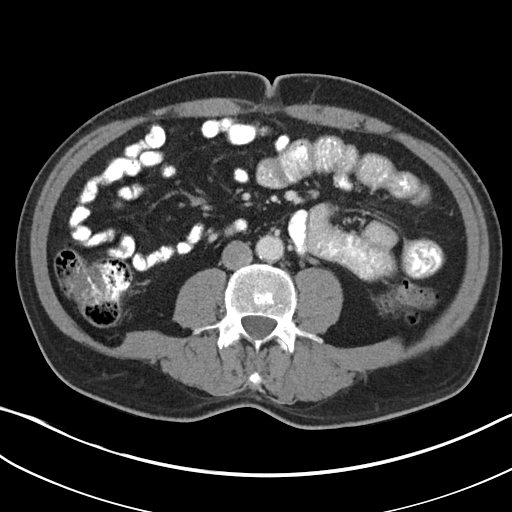
[im 16/54  soft-tissue]
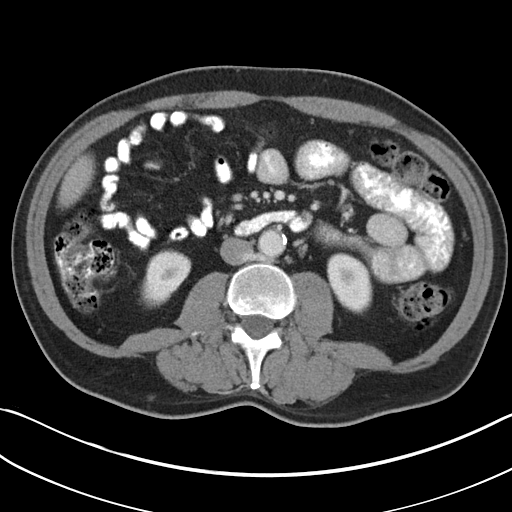
[im 19/54  soft-tissue]
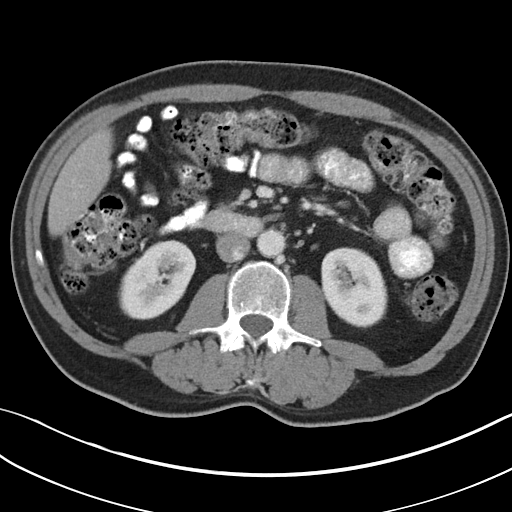
[im 23/54  soft-tissue]
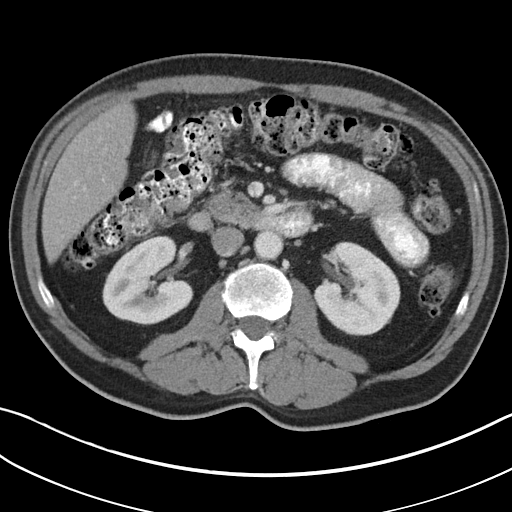
[im 27/54  soft-tissue]
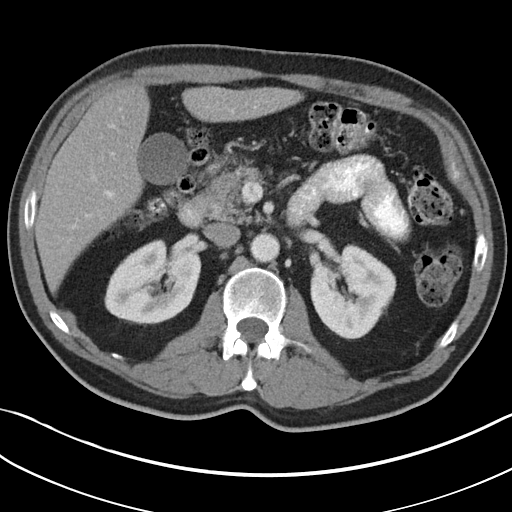
[im 31/54  soft-tissue]
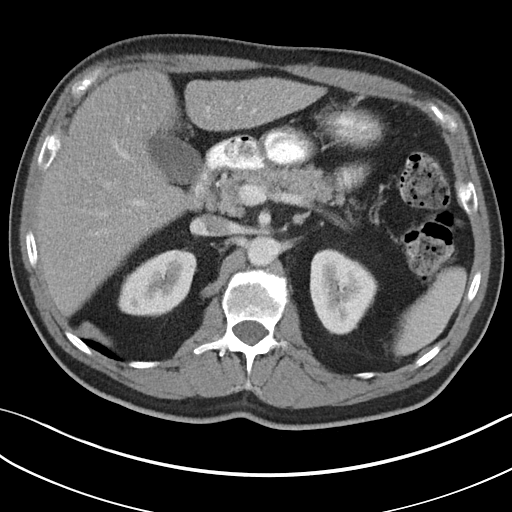
[im 35/54  soft-tissue]
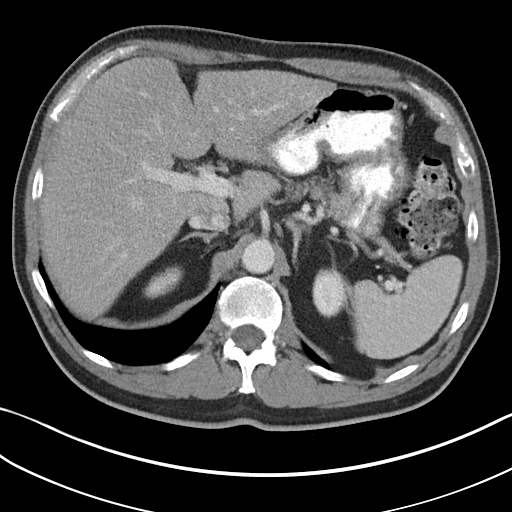
[im 35/54  bone]
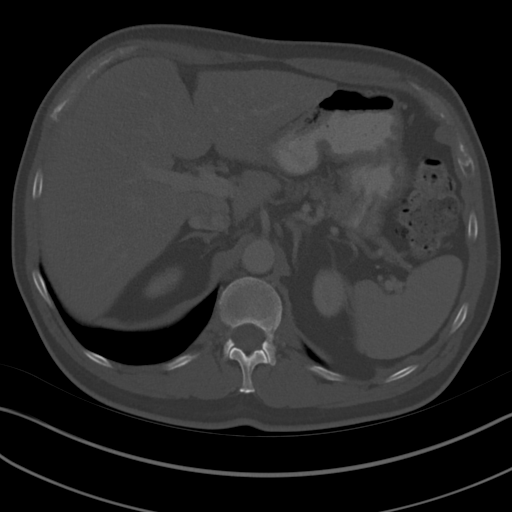
[im 38/54  soft-tissue]
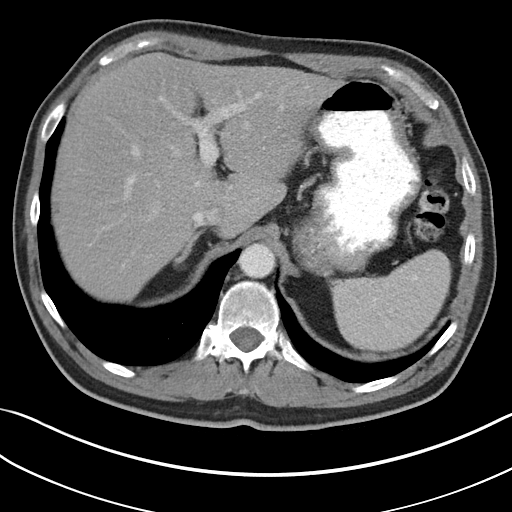
[im 42/54  soft-tissue]
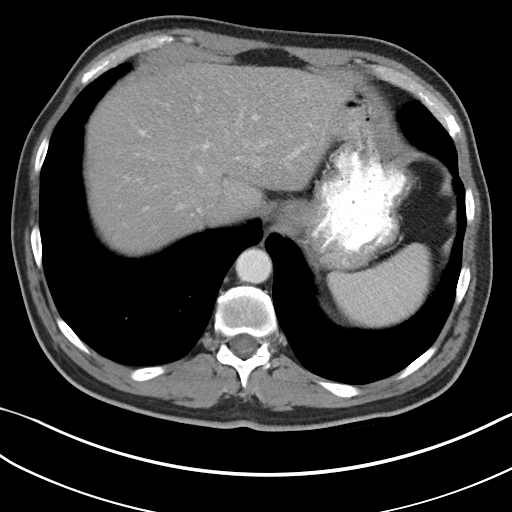
[im 46/54  soft-tissue]
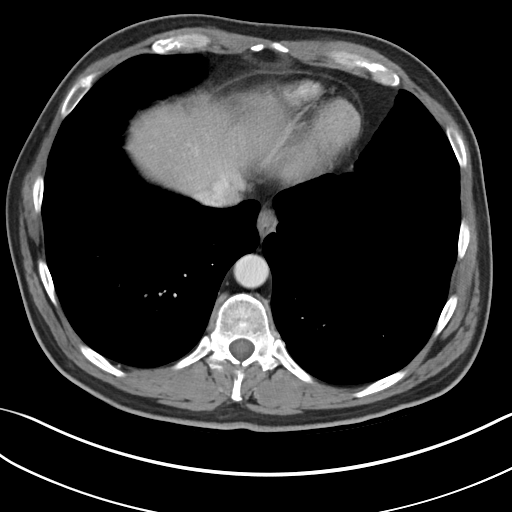
[im 50/54  soft-tissue]
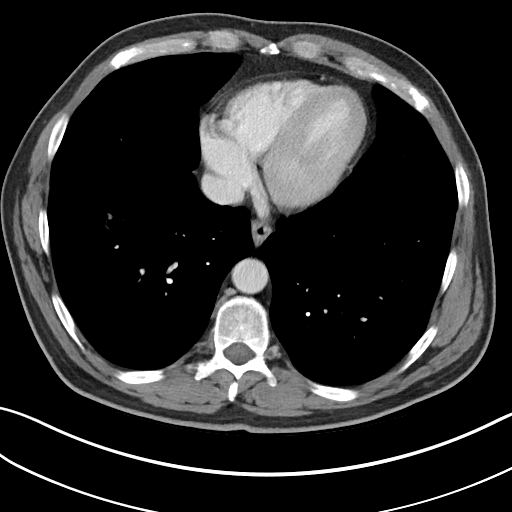

[Series 8: cor routine with · coronal · 0.53mm/px · 3 of 156 slices shown]
[im 52/156  soft-tissue]
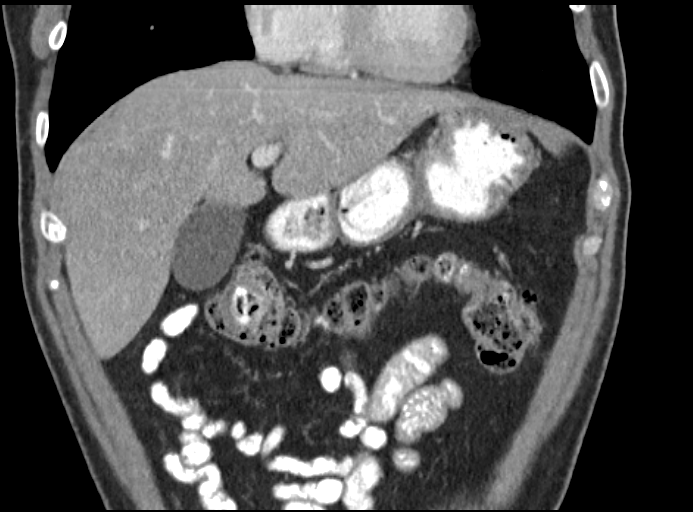
[im 69/156  soft-tissue]
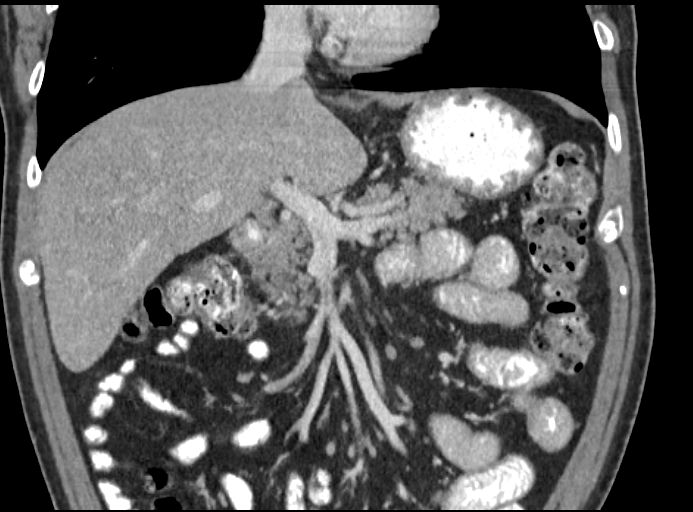
[im 87/156  soft-tissue]
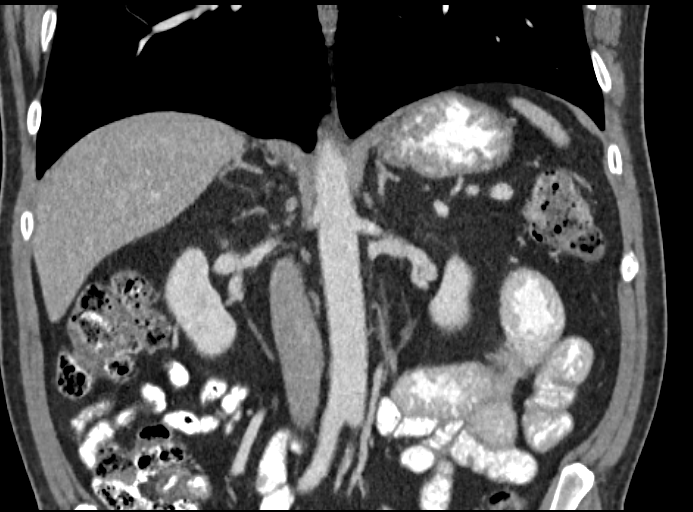

[16 of 46 positions shown; findings below may reference images not displayed]

FINDINGS: Lower chest: No acute findings.

Hepatobiliary: Mild to moderate diffuse hepatic steatosis noted. No
liver masses identified. Gallbladder is unremarkable.

Pancreas: No mass, inflammatory changes, or other parenchymal
abnormality identified.

Spleen:  Within normal limits in size and appearance.

Adrenal Glands:  No mass identified.

Kidneys:  No masses identified.  No evidence of hydronephrosis.

Stomach/Bowel/Peritoneum: Diverticulosis is seen involving
visualized portion of descending colon, however there is no evidence
of diverticulitis in this region. Moderate to large colonic stool
burden noted.

Vascular/Lymphatic: No pathologically enlarged lymph nodes
identified. No abdominal aortic aneurysm or other significant
retroperitoneal abnormality demonstrated.

Other:  None.

Musculoskeletal:  No suspicious bone lesions identified.
IMPRESSION: No radiographic evidence of pancreatitis or other acute findings.

Hepatic steatosis.

## 2016-05-20 LAB — HEMOGLOBIN A1C
Hemoglobin A1C: 12.2
Hemoglobin A1C: <7

## 2016-05-20 LAB — MICROALBUMIN, URINE: Microalb, Ur: <7

## 2016-11-12 ENCOUNTER — Encounter: Payer: Self-pay | Admitting: Family Medicine

## 2017-08-02 DIAGNOSIS — E1142 Type 2 diabetes mellitus with diabetic polyneuropathy: Secondary | ICD-10-CM | POA: Insufficient documentation

## 2017-09-21 ENCOUNTER — Encounter: Payer: Self-pay | Admitting: Family Medicine

## 2017-09-21 ENCOUNTER — Ambulatory Visit
Admission: RE | Admit: 2017-09-21 | Discharge: 2017-09-21 | Disposition: A | Discharge status: Home / Self Care | Payer: BC Managed Care – PPO | Source: Ambulatory Visit | Attending: Family Medicine | Admitting: Family Medicine

## 2017-09-21 ENCOUNTER — Other Ambulatory Visit: Payer: Self-pay | Admitting: Family Medicine

## 2017-09-21 ENCOUNTER — Ambulatory Visit: Payer: BC Managed Care – PPO | Admitting: Family Medicine

## 2017-09-21 VITALS — BP 144/92 | HR 76 | Temp 98.4°F | Resp 16 | Wt 191.0 lb

## 2017-09-21 DIAGNOSIS — G8929 Other chronic pain: Secondary | ICD-10-CM | POA: Diagnosis not present

## 2017-09-21 DIAGNOSIS — M25512 Pain in left shoulder: Secondary | ICD-10-CM

## 2017-09-21 DIAGNOSIS — M19012 Primary osteoarthritis, left shoulder: Secondary | ICD-10-CM | POA: Diagnosis not present

## 2017-09-21 MED ORDER — MELOXICAM 15 MG PO TABS: 15.0000 mg | ORAL_TABLET | Freq: Every day | ORAL | 0 refills | Status: AC

## 2017-09-21 NOTE — Progress Notes (Signed)
  Subjective:     Patient ID: Roger Ruiz, male   DOB: 1966-03-30, 52 y.o.   MRN: [id number] Chief Complaint  Patient presents with  . Shoulder Pain    Left side; started Feb after a "deep tissue massage."    HPI States he could not raise his shoulder for a while but is a bit more mobile now. Reports he took an occasional aspirin but no scheduled medication. Continues to work as a Engineer, drillingprobation officer and feels vulnerable with this limitation.  Review of Systems     Objective:   Physical Exam  Constitutional: He appears well-developed and well-nourished. No distress.  Musculoskeletal:  Left shoulder strength 5/5. Increased pain with resisted. Abduction. Unable to flex > 90 degrees.       Assessment:    1. Chronic left shoulder pain - DG Shoulder Left; Future - meloxicam (MOBIC) 15 MG tablet; Take 1 tablet (15 mg total) by mouth daily.  Dispense: 30 tablet; Refill: 0      Plan:    Further f/u pending x-ray report. Likely orthopedic evaluation.

## 2017-09-21 NOTE — Patient Instructions (Signed)
We will call you the x-ray result.

## 2018-05-17 DIAGNOSIS — Z8639 Personal history of other endocrine, nutritional and metabolic disease: Secondary | ICD-10-CM | POA: Insufficient documentation

## 2019-07-06 NOTE — Progress Notes (Signed)
Patient: Roger Ruiz Male    DOB: April 22, 1965   54 y.o.   MRN: [id number] Visit Date: 07/11/2019  Today's Provider: Megan Mans, MD   Chief Complaint  Patient presents with  . Follow-up  . Diabetes   Subjective:     HPI  Patient has had diabetes for 10 years and is followed by endocrinology.  He felt bad last week, no fevers no cough no Covid symptoms.  He is feeling back to normal now.  He has a stressful job as a  Engineer, drilling.  He is having no chest pain no shortness of breath. Does not complain of chronic low back pain and trouble urinating recently.  No dysuria no fevers no flank or CVA area pain. Diabetes On Metformin and Farxiga.  No Known Allergies   Current Outpatient Medications:  .  FARXIGA 10 MG TABS tablet, , Disp: , Rfl:  .  metFORMIN (GLUCOPHAGE) 1000 MG tablet, One tablet twice daily with meals, Disp: 60 tablet, Rfl: 2 .  ONETOUCH DELICA LANCETS 33G MISC, , Disp: , Rfl:  .  ONETOUCH VERIO test strip, , Disp: , Rfl:  .  TRESIBA FLEXTOUCH 200 UNIT/ML SOPN, , Disp: , Rfl:  .  cyclobenzaprine (FLEXERIL) 5 MG tablet, One or two at bedtime as needed for calf spasms (Patient not taking: Reported on 07/11/2019), Disp: 14 tablet, Rfl: 0 .  meloxicam (MOBIC) 15 MG tablet, Take 1 tablet (15 mg total) by mouth daily. (Patient not taking: Reported on 07/11/2019), Disp: 30 tablet, Rfl: 0  Review of Systems  Constitutional: Negative for appetite change, chills and fever.  Eyes: Negative.   Respiratory: Negative for chest tightness, shortness of breath and wheezing.   Cardiovascular: Negative for chest pain and palpitations.  Gastrointestinal: Negative for abdominal pain, nausea and vomiting.  Endocrine: Negative.   Genitourinary: Positive for decreased urine volume and difficulty urinating.  Musculoskeletal: Positive for back pain.  Skin: Negative.   Allergic/Immunologic: Negative.   Hematological: Negative.   Psychiatric/Behavioral: Negative.      Social History   Tobacco Use  . Smoking status: Never Smoker  . Smokeless tobacco: Never Used  Substance Use Topics  . Alcohol use: No    Alcohol/week: 0.0 standard drinks      Objective:   BP 122/82 (BP Location: Right Arm, Patient Position: Sitting, Cuff Size: Large)   Pulse 85   Temp (!) 97.5 F (36.4 C) (Other (Comment))   Resp 16   Ht 5\' 11"  (1.803 m)   Wt 194 lb (88 kg)   SpO2 96%   BMI 27.06 kg/m  Vitals:   07/11/19 1559  BP: 122/82  Pulse: 85  Resp: 16  Temp: (!) 97.5 F (36.4 C)  TempSrc: Other (Comment)  SpO2: 96%  Weight: 194 lb (88 kg)  Height: 5\' 11"  (1.803 m)  Body mass index is 27.06 kg/m.   Physical Exam Vitals reviewed.  HENT:     Head: Normocephalic and atraumatic.     Right Ear: Tympanic membrane and external ear normal.     Left Ear: Tympanic membrane and external ear normal.  Eyes:     Conjunctiva/sclera: Conjunctivae normal.  Cardiovascular:     Rate and Rhythm: Normal rate and regular rhythm.     Pulses: Normal pulses.     Heart sounds: Normal heart sounds.  Pulmonary:     Breath sounds: Normal breath sounds.  Abdominal:     Palpations: Abdomen is soft.  Skin:  General: Skin is warm and dry.  Neurological:     General: No focal deficit present.     Mental Status: He is alert and oriented to person, place, and time.  Psychiatric:        Mood and Affect: Mood normal.        Behavior: Behavior normal.        Thought Content: Thought content normal.        Judgment: Judgment normal.      Results for orders placed or performed in visit on 07/11/19  POCT glycosylated hemoglobin (Hb A1C)  Result Value Ref Range   Hemoglobin A1C 8.2 (A) 4.0 - 5.6 %   Est. average glucose Bld gHb Est-mCnc 189   POCT UA - Microalbumin  Result Value Ref Range   Microalbumin Ur, POC Negative mg/L       Assessment & Plan    1. Type 2 diabetes mellitus without complication, without long-term current use of insulin (HCC) A1c is 8.2  today.  Followed by endocrinology.  Check today as he has not been seen here in 2 years. Microalbumin is negative and blood pressure is normal so he does not have to start an ACE or ARB at this time. - POCT glycosylated hemoglobin (Hb A1C) 8.2 today.  - CBC w/Diff/Platelet - Comprehensive Metabolic Panel (CMET) - Lipid panel - TSH - PSA - POCT UA - Microalbumin--Negative.  2. Hyperlipidemia, unspecified hyperlipidemia type Told patient will probably need to start a statin to get LDL below 70. - CBC w/Diff/Platelet - Comprehensive Metabolic Panel (CMET) - Lipid panel - TSH - PSA  3. Low back pain, unspecified back pain laterality, unspecified chronicity, unspecified whether sciatica present Mechanical low back pain.  Use topical treatments heat and stretching. - CBC w/Diff/Platelet - Comprehensive Metabolic Panel (CMET) - Lipid panel - TSH - PSA  4. Benign prostatic hyperplasia, unspecified whether lower urinary tract symptoms present Consider Flomax.  Patient is scheduled for physical this summer as he is not had one in probably a decade. - CBC w/Diff/Platelet - Comprehensive Metabolic Panel (CMET) - Lipid panel - TSH - PSA     Wilhemena Durie, MD  [facility]

## 2019-07-11 ENCOUNTER — Encounter: Payer: Self-pay | Admitting: Family Medicine

## 2019-07-11 ENCOUNTER — Ambulatory Visit: Payer: BC Managed Care – PPO | Admitting: Family Medicine

## 2019-07-11 ENCOUNTER — Other Ambulatory Visit: Payer: Self-pay

## 2019-07-11 VITALS — BP 122/82 | HR 85 | Temp 97.5°F | Resp 16 | Ht 71.0 in | Wt 194.0 lb

## 2019-07-11 DIAGNOSIS — M545 Low back pain, unspecified: Secondary | ICD-10-CM

## 2019-07-11 DIAGNOSIS — E785 Hyperlipidemia, unspecified: Secondary | ICD-10-CM

## 2019-07-11 DIAGNOSIS — N400 Enlarged prostate without lower urinary tract symptoms: Secondary | ICD-10-CM

## 2019-07-11 DIAGNOSIS — E119 Type 2 diabetes mellitus without complications: Secondary | ICD-10-CM

## 2019-07-11 LAB — POCT UA - MICROALBUMIN: Microalbumin Ur, POC: NEGATIVE mg/L

## 2019-07-11 LAB — POCT GLYCOSYLATED HEMOGLOBIN (HGB A1C)
Est. average glucose Bld gHb Est-mCnc: 189
Hemoglobin A1C: 8.2 % — AB (ref 4.0–5.6)

## 2019-07-11 MED ORDER — FARXIGA 10 MG PO TABS: 10.0000 mg | ORAL_TABLET | Freq: Every day | ORAL | 5 refills | Status: AC

## 2019-07-11 MED ORDER — FIFTY50 PEN NEEDLES 31G X 5 MM MISC: 5 refills | Status: AC

## 2019-09-06 ENCOUNTER — Telehealth: Payer: Self-pay

## 2019-09-06 DIAGNOSIS — E785 Hyperlipidemia, unspecified: Secondary | ICD-10-CM

## 2019-09-06 LAB — CBC WITH DIFFERENTIAL/PLATELET
Basophils Absolute: 0.1 10*3/uL (ref 0.0–0.2)
Basos: 1 %
EOS (ABSOLUTE): 0.1 10*3/uL (ref 0.0–0.4)
Eos: 2 %
Hematocrit: 44.8 % (ref 37.5–51.0)
Hemoglobin: 15.9 g/dL (ref 13.0–17.7)
Immature Grans (Abs): 0 10*3/uL (ref 0.0–0.1)
Immature Granulocytes: 0 %
Lymphocytes Absolute: 1.9 10*3/uL (ref 0.7–3.1)
Lymphs: 26 %
MCH: 30.8 pg (ref 26.6–33.0)
MCHC: 35.5 g/dL (ref 31.5–35.7)
MCV: 87 fL (ref 79–97)
Monocytes Absolute: 0.5 10*3/uL (ref 0.1–0.9)
Monocytes: 7 %
Neutrophils Absolute: 4.8 10*3/uL (ref 1.4–7.0)
Neutrophils: 64 %
Platelets: 236 10*3/uL (ref 150–450)
RBC: 5.16 x10E6/uL (ref 4.14–5.80)
RDW: 12.5 % (ref 11.6–15.4)
WBC: 7.4 10*3/uL (ref 3.4–10.8)

## 2019-09-06 LAB — COMPREHENSIVE METABOLIC PANEL
ALT: 14 IU/L (ref 0–44)
AST: 15 IU/L (ref 0–40)
Albumin/Globulin Ratio: 1.7 (ref 1.2–2.2)
Albumin: 4.3 g/dL (ref 3.8–4.9)
Alkaline Phosphatase: 57 IU/L (ref 48–121)
BUN/Creatinine Ratio: 16 (ref 9–20)
BUN: 16 mg/dL (ref 6–24)
Bilirubin Total: 0.6 mg/dL (ref 0.0–1.2)
CO2: 22 mmol/L (ref 20–29)
Calcium: 8.9 mg/dL (ref 8.7–10.2)
Chloride: 104 mmol/L (ref 96–106)
Creatinine, Ser: 0.98 mg/dL (ref 0.76–1.27)
GFR calc Af Amer: 101 mL/min/{1.73_m2} (ref >59)
GFR calc non Af Amer: 87 mL/min/{1.73_m2} (ref >59)
Globulin, Total: 2.6 g/dL (ref 1.5–4.5)
Glucose: 147 mg/dL — ABNORMAL HIGH (ref 65–99)
Potassium: 4.3 mmol/L (ref 3.5–5.2)
Sodium: 140 mmol/L (ref 134–144)
Total Protein: 6.9 g/dL (ref 6.0–8.5)

## 2019-09-06 LAB — LIPID PANEL
Chol/HDL Ratio: 4.7 ratio (ref 0.0–5.0)
Cholesterol, Total: 175 mg/dL (ref 100–199)
HDL: 37 mg/dL — ABNORMAL LOW (ref >39)
LDL Chol Calc (NIH): 116 mg/dL — ABNORMAL HIGH (ref 0–99)
Triglycerides: 120 mg/dL (ref 0–149)
VLDL Cholesterol Cal: 22 mg/dL (ref 5–40)

## 2019-09-06 LAB — TSH: TSH: 0.833 u[IU]/mL (ref 0.450–4.500)

## 2019-09-06 LAB — PSA: Prostate Specific Ag, Serum: 0.6 ng/mL (ref 0.0–4.0)

## 2019-09-06 NOTE — Telephone Encounter (Signed)
Called to advise patient, no answer. LVMTCB, if patient calls back it is okay for someone else to advise.

## 2019-09-06 NOTE — Telephone Encounter (Signed)
-----   Message from Roger Ruiz., MD sent at 09/06/2019  8:43 AM EDT ----- Recommend Crestor 20 mg daily for mildly elevated cholesterol in the face of his diabetes.  Follow-up with me in 2 to 3 months on this.

## 2019-09-16 MED ORDER — ROSUVASTATIN CALCIUM 20 MG PO TABS: 20.0000 mg | ORAL_TABLET | Freq: Every day | ORAL | 3 refills | Status: DC

## 2019-09-16 NOTE — Telephone Encounter (Signed)
Patient was notified of results. Expressed understanding. Rx for Crestor sent to pharmacy.

## 2019-10-31 NOTE — Progress Notes (Signed)
Complete physical exam  I,April Miller,acting as a scribe for Megan Mans, MD.,have documented all relevant documentation on the behalf of Megan Mans, MD,as directed by  Megan Mans, MD while in the presence of Megan Mans, MD.   Patient: Roger Ruiz   DOB: [birth year]   54 y.o. Male  MRN: [id number] Visit Date: 11/02/2019  Today's healthcare provider: Megan Mans, MD   Chief Complaint  Patient presents with  . Annual Exam   Subjective    Roger Ruiz is a 54 y.o. male who presents today for a complete physical exam.  He reports consuming a general diet. The patient does not participate in regular exercise at present. He generally feels well. He reports sleeping well. He does not have additional problems to discuss today.  He is married and the father of 1 son.  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 HPI    Past Medical History:  Diagnosis Date  . Diabetes mellitus without complication [facility])    Past Surgical History:  Procedure Laterality Date  . no surgeries     Social History   Socioeconomic History  . Marital status: Married    Spouse name: Not on file  . Number of children: Not on file  . Years of education: Not on file  . Highest education level: Not on file  Occupational History  . Not on file  Tobacco Use  . Smoking status: Never Smoker  . Smokeless tobacco: Never Used  Substance and Sexual Activity  . Alcohol use: No    Alcohol/week: 0.0 standard drinks  . Drug use: Not on file  . Sexual activity: Not on file  Other Topics Concern  . Not on file  Social History Narrative  . Not on file   Social Determinants of Health   Financial Resource Strain:   . Difficulty of Paying Living Expenses:   Food Insecurity:   . Worried About Programme researcher, broadcasting/film/video in the Last Year:   . Barista in the Last Year:   Transportation Needs:   . Freight forwarder (Medical):   Marland Kitchen Lack of  Transportation (Non-Medical):   Physical Activity:   . Days of Exercise per Week:   . Minutes of Exercise per Session:   Stress:   . Feeling of Stress :   Social Connections:   . Frequency of Communication with Friends and Family:   . Frequency of Social Gatherings with Friends and Family:   . Attends Religious Services:   . Active Member of Clubs or Organizations:   . Attends Banker Meetings:   Marland Kitchen Marital Status:   Intimate Partner Violence:   . Fear of Current or Ex-Partner:   . Emotionally Abused:   Marland Kitchen Physically Abused:   . Sexually Abused:    Family Status  Relation Name Status  . Mother  Alive  . Father  Deceased       died in MVA  . Brother  Alive   Family History  Problem Relation Age of Onset  . Diverticulitis Mother   . Hypertension Father    No Known Allergies  Patient Care Team: Maple Hudson., MD as PCP - General (Family Medicine)   Medications: Outpatient Medications Prior to Visit  Medication Sig  . FARXIGA 10 MG TABS tablet Take 10 mg by mouth daily.  . Insulin Pen Needle (FIFTY50 PEN NEEDLES) 31G X 5  MM MISC Use as directed for up to 30 days  . metFORMIN (GLUCOPHAGE) 1000 MG tablet One tablet twice daily with meals  . ONETOUCH DELICA LANCETS 33G MISC   . ONETOUCH VERIO test strip   . TRESIBA FLEXTOUCH 200 UNIT/ML SOPN   . [DISCONTINUED] rosuvastatin (CRESTOR) 20 MG tablet Take 1 tablet (20 mg total) by mouth daily.  . cyclobenzaprine (FLEXERIL) 5 MG tablet One or two at bedtime as needed for calf spasms (Patient not taking: Reported on 07/11/2019)  . meloxicam (MOBIC) 15 MG tablet Take 1 tablet (15 mg total) by mouth daily. (Patient not taking: Reported on 07/11/2019)   No facility-administered medications prior to visit.    Review of Systems  HENT: Positive for tinnitus.   Endocrine: Positive for cold intolerance and polyuria.  Genitourinary: Positive for frequency.  All other systems reviewed and are negative.      Objective    BP 134/80 (BP Location: Right Arm, Patient Position: Sitting, Cuff Size: Large)   Pulse 83   Temp (!) 97 F (36.1 C) (Other (Comment))   Resp 16   Ht 5\' 11"  (1.803 m)   Wt 193 lb (87.5 kg)   SpO2 97%   BMI 26.92 kg/m    Physical Exam Vitals reviewed.  HENT:     Head: Normocephalic and atraumatic.     Right Ear: Tympanic membrane and external ear normal.     Left Ear: Tympanic membrane and external ear normal.     Nose: Nose normal.  Eyes:     Conjunctiva/sclera: Conjunctivae normal.  Cardiovascular:     Rate and Rhythm: Normal rate and regular rhythm.     Pulses: Normal pulses.     Heart sounds: Normal heart sounds.  Pulmonary:     Breath sounds: Normal breath sounds.  Abdominal:     Palpations: Abdomen is soft.  Genitourinary:    Penis: Normal.      Testes: Normal.  Skin:    General: Skin is warm and dry.  Neurological:     General: No focal deficit present.     Mental Status: He is alert and oriented to person, place, and time.  Psychiatric:        Mood and Affect: Mood normal.        Behavior: Behavior normal.        Thought Content: Thought content normal.        Judgment: Judgment normal.     BP 134/80 (BP Location: Right Arm, Patient Position: Sitting, Cuff Size: Large)   Pulse 83   Temp (!) 97 F (36.1 C) (Other (Comment))   Resp 16   Ht 5\' 11"  (1.803 m)   Wt 193 lb (87.5 kg)   SpO2 97%   BMI 26.92 kg/m   General Appearance:    Alert, cooperative, no distress, appears stated age  Head:    Normocephalic, without obvious abnormality, atraumatic  Eyes:    PERRL, conjunctiva/corneas clear, EOM's intact, fundi    benign, both eyes       Ears:    Normal TM's and external ear canals, both ears  Nose:   Nares normal, septum midline, mucosa normal, no drainage   or sinus tenderness  Throat:   Lips, mucosa, and tongue normal; teeth and gums normal  Neck:   Supple, symmetrical, trachea midline, no adenopathy;       thyroid:  No  enlargement/tenderness/nodules; no carotid   bruit or JVD  Back:     Symmetric, no curvature,  ROM normal, no CVA tenderness  Lungs:     Clear to auscultation bilaterally, respirations unlabored  Chest wall:    No tenderness or deformity  Heart:    Regular rate and rhythm, S1 and S2 normal, no murmur, rub   or gallop  Abdomen:     Soft, non-tender, bowel sounds active all four quadrants,    no masses, no organomegaly  Genitalia:    Normal male without lesion, discharge or tenderness  Rectal:    Normal tone, normal prostate, no masses or tenderness;   guaiac negative stool  Extremities:   Extremities normal, atraumatic, no cyanosis or edema  Pulses:   2+ and symmetric all extremities  Skin:   Skin color, texture, turgor normal, no rashes or lesions  Lymph nodes:   Cervical, supraclavicular, and axillary nodes normal  Neurologic:   CNII-XII intact. Normal strength, sensation and reflexes      throughout     Last depression screening scores PHQ 2/9 Scores 11/02/2019  PHQ - 2 Score 1  PHQ- 9 Score 3   Last fall risk screening No flowsheet data found. Last Audit-C alcohol use screening Alcohol Use Disorder Test (AUDIT) 11/02/2019  1. How often do you have a drink containing alcohol? 2  2. How many drinks containing alcohol do you have on a typical day when you are drinking? 0  3. How often do you have six or more drinks on one occasion? 0  AUDIT-C Score 2  Alcohol Brief Interventions/Follow-up AUDIT Score <7 follow-up not indicated   A score of 3 or more in women, and 4 or more in men indicates increased risk for alcohol abuse, EXCEPT if all of the points are from question 1   No results found for any visits on 11/02/19.  Assessment & Plan    Routine Health Maintenance and Physical Exam  Exercise Activities and Dietary recommendations Goals   None     Immunization History  Administered Date(s) Administered  . Influenza,inj,Quad PF,6+ Mos 01/24/2015  . Tdap 11/02/2019     Health Maintenance  Topic Date Due  . PNEUMOCOCCAL POLYSACCHARIDE VACCINE AGE 79-64 HIGH RISK  Never done  . FOOT EXAM  Never done  . OPHTHALMOLOGY EXAM  Never done  . COVID-19 Vaccine (1) Never done  . HIV Screening  Never done  . COLONOSCOPY  Never done  . INFLUENZA VACCINE  11/13/2019  . HEMOGLOBIN A1C  01/11/2020  . URINE MICROALBUMIN  07/10/2020  . TETANUS/TDAP  11/01/2029  . Hepatitis C Screening  Completed    Discussed health benefits of physical activity, and encouraged him to engage in regular exercise appropriate for his age and condition.  1. Annual physical exam   2. Screening for colon cancer  - Ambulatory referral to Gastroenterology  3. Hyperlipidemia, unspecified hyperlipidemia type Resent Crestor 20 mg qd to pharmacy.  Return to clinic 2 months. - rosuvastatin (CRESTOR) 20 MG tablet; Take 1 tablet (20 mg total) by mouth daily.  Dispense: 90 tablet; Refill: 3  4. Need for Tdap vaccination  - Tdap vaccine greater than or equal to 7yo IM   Return in about 2 months (around 01/03/2020).     I, Megan Mans, MD, have reviewed all documentation for this visit. The documentation on 11/06/19 for the exam, diagnosis, procedures, and orders are all accurate and complete.    Shone Leventhal Wendelyn Breslow, MD  [facility] [phone] (phone) [phone] (fax)  [facility]

## 2019-11-02 ENCOUNTER — Encounter: Payer: Self-pay | Admitting: Family Medicine

## 2019-11-02 ENCOUNTER — Ambulatory Visit (INDEPENDENT_AMBULATORY_CARE_PROVIDER_SITE_OTHER): Payer: BC Managed Care – PPO | Admitting: Family Medicine

## 2019-11-02 ENCOUNTER — Other Ambulatory Visit: Payer: Self-pay

## 2019-11-02 VITALS — BP 134/80 | HR 83 | Temp 97.0°F | Resp 16 | Ht 71.0 in | Wt 193.0 lb

## 2019-11-02 DIAGNOSIS — E785 Hyperlipidemia, unspecified: Secondary | ICD-10-CM

## 2019-11-02 DIAGNOSIS — Z0000 Encounter for general adult medical examination without abnormal findings: Secondary | ICD-10-CM

## 2019-11-02 DIAGNOSIS — Z1211 Encounter for screening for malignant neoplasm of colon: Secondary | ICD-10-CM

## 2019-11-02 DIAGNOSIS — Z23 Encounter for immunization: Secondary | ICD-10-CM

## 2019-11-02 MED ORDER — ROSUVASTATIN CALCIUM 20 MG PO TABS: 20.0000 mg | ORAL_TABLET | Freq: Every day | ORAL | 3 refills | Status: AC

## 2019-11-09 ENCOUNTER — Telehealth (INDEPENDENT_AMBULATORY_CARE_PROVIDER_SITE_OTHER): Payer: Self-pay | Admitting: Gastroenterology

## 2019-11-09 ENCOUNTER — Other Ambulatory Visit: Payer: Self-pay

## 2019-11-09 DIAGNOSIS — Z1211 Encounter for screening for malignant neoplasm of colon: Secondary | ICD-10-CM

## 2019-11-09 MED ORDER — PEG 3350-KCL-NA BICARB-NACL 420 G PO SOLR: 4000.0000 mL | Freq: Once | ORAL | 0 refills | Status: AC

## 2019-11-09 NOTE — Progress Notes (Signed)
Gastroenterology Pre-Procedure Review  Request Date: Tuesday 12/06/19 Requesting Physician: Dr. Tobi Bastos  PATIENT REVIEW QUESTIONS: The patient responded to the following health history questions as indicated:    1. Are you having any GI issues? no 2. Do you have a personal history of Polyps? no 3. Do you have a family history of Colon Cancer or Polyps? no 4. Diabetes Mellitus? yes (type 2 oral meds and triseba) 5. Joint replacements in the past 12 months?no 6. Major health problems in the past 3 months?no 7. Any artificial heart valves, MVP, or defibrillator?no    MEDICATIONS & ALLERGIES:    Patient reports the following regarding taking any anticoagulation/antiplatelet therapy:   Plavix, Coumadin, Eliquis, Xarelto, Lovenox, Pradaxa, Brilinta, or Effient? no Aspirin? no  Patient confirms/reports the following medications:  Current Outpatient Medications  Medication Sig Dispense Refill  . FARXIGA 10 MG TABS tablet Take 10 mg by mouth daily. 30 tablet 5  . Insulin Pen Needle (FIFTY50 PEN NEEDLES) 31G X 5 MM MISC Use as directed for up to 30 days 50 each 5  . metFORMIN (GLUCOPHAGE) 1000 MG tablet One tablet twice daily with meals 60 tablet 2  . ONETOUCH DELICA LANCETS 33G MISC     . ONETOUCH VERIO test strip     . TRESIBA FLEXTOUCH 200 UNIT/ML SOPN     . cyclobenzaprine (FLEXERIL) 5 MG tablet One or two at bedtime as needed for calf spasms (Patient not taking: Reported on 07/11/2019) 14 tablet 0  . meloxicam (MOBIC) 15 MG tablet Take 1 tablet (15 mg total) by mouth daily. (Patient not taking: Reported on 07/11/2019) 30 tablet 0  . polyethylene glycol-electrolytes (NULYTELY) 420 g solution Take 4,000 mLs by mouth once for 1 dose. 4000 mL 0  . rosuvastatin (CRESTOR) 20 MG tablet Take 1 tablet (20 mg total) by mouth daily. (Patient not taking: Reported on 11/09/2019) 90 tablet 3   No current facility-administered medications for this visit.    Patient confirms/reports the following  allergies:  No Known Allergies  No orders of the defined types were placed in this encounter.   AUTHORIZATION INFORMATION Primary Insurance: 1D#: Group #:  Secondary Insurance: 1D#: Group #:  SCHEDULE INFORMATION: Date: Tuesday 08/24 Time: Location:[facility]

## 2019-12-02 ENCOUNTER — Other Ambulatory Visit
Admission: RE | Admit: 2019-12-02 | Discharge: 2019-12-02 | Disposition: A | Discharge status: Home / Self Care | Payer: BC Managed Care – PPO | Source: Ambulatory Visit | Attending: Gastroenterology | Admitting: Gastroenterology

## 2019-12-02 ENCOUNTER — Other Ambulatory Visit: Payer: Self-pay

## 2019-12-02 DIAGNOSIS — Z20822 Contact with and (suspected) exposure to covid-19: Secondary | ICD-10-CM | POA: Insufficient documentation

## 2019-12-02 DIAGNOSIS — Z01812 Encounter for preprocedural laboratory examination: Secondary | ICD-10-CM | POA: Insufficient documentation

## 2019-12-03 LAB — SARS CORONAVIRUS 2 (TAT 6-24 HRS): SARS Coronavirus 2: NEGATIVE

## 2019-12-06 ENCOUNTER — Ambulatory Visit: Payer: BC Managed Care – PPO | Admitting: Certified Registered"

## 2019-12-06 ENCOUNTER — Encounter
Admission: RE | Disposition: A | Discharge status: Home / Self Care | Payer: Self-pay | Source: Home / Self Care | Attending: Gastroenterology

## 2019-12-06 ENCOUNTER — Ambulatory Visit
Admission: RE | Admit: 2019-12-06 | Discharge: 2019-12-06 | Disposition: A | Discharge status: Home / Self Care | Payer: BC Managed Care – PPO | Attending: Gastroenterology | Admitting: Gastroenterology

## 2019-12-06 ENCOUNTER — Encounter: Payer: Self-pay | Admitting: Gastroenterology

## 2019-12-06 ENCOUNTER — Other Ambulatory Visit: Payer: Self-pay

## 2019-12-06 DIAGNOSIS — Z1211 Encounter for screening for malignant neoplasm of colon: Secondary | ICD-10-CM | POA: Diagnosis not present

## 2019-12-06 DIAGNOSIS — E119 Type 2 diabetes mellitus without complications: Secondary | ICD-10-CM | POA: Insufficient documentation

## 2019-12-06 DIAGNOSIS — Z794 Long term (current) use of insulin: Secondary | ICD-10-CM | POA: Diagnosis not present

## 2019-12-06 HISTORY — PX: COLONOSCOPY WITH PROPOFOL: SHX5780

## 2019-12-06 LAB — GLUCOSE, CAPILLARY: Glucose-Capillary: 106 mg/dL — ABNORMAL HIGH (ref 70–99)

## 2019-12-06 SURGERY — COLONOSCOPY WITH PROPOFOL
Anesthesia: General

## 2019-12-06 MED ORDER — PROPOFOL 500 MG/50ML IV EMUL
INTRAVENOUS | Status: DC | PRN
  Administered 2019-12-06: 150 ug/kg/min via INTRAVENOUS

## 2019-12-06 MED ORDER — PROPOFOL 500 MG/50ML IV EMUL
INTRAVENOUS | Status: AC
  Filled 2019-12-06: qty 50

## 2019-12-06 MED ORDER — SODIUM CHLORIDE 0.9 % IV SOLN: INTRAVENOUS | Status: DC

## 2019-12-06 NOTE — H&P (Signed)
[name], MD [address], [place], [place], [address] [address], [address], [place], [place], [address] Phone: [phone]  Fax: [phone]  Primary Care Physician:  Maple Hudson., MD   Pre-Procedure History & Physical: HPI:  Roger Ruiz is a 54 y.o. male is here for an colonoscopy.   Past Medical History:  Diagnosis Date  . Diabetes mellitus without complication [facility])     Past Surgical History:  Procedure Laterality Date  . no surgeries      Prior to Admission medications   Medication Sig Start Date End Date Taking? Authorizing Provider  cyclobenzaprine (FLEXERIL) 5 MG tablet One or two at bedtime as needed for calf spasms Patient not taking: Reported on 07/11/2019 11/23/15   Anola Gurney, PA  FARXIGA 10 MG TABS tablet Take 10 mg by mouth daily. 07/11/19   Maple Hudson., MD  Insulin Pen Needle (FIFTY50 PEN NEEDLES) 31G X 5 MM MISC Use as directed for up to 30 days 07/11/19   Maple Hudson., MD  meloxicam (MOBIC) 15 MG tablet Take 1 tablet (15 mg total) by mouth daily. Patient not taking: Reported on 07/11/2019 09/21/17   Anola Gurney, PA  metFORMIN (GLUCOPHAGE) 1000 MG tablet One tablet twice daily with meals 01/24/15   Anola Gurney, PA  [facility] DELICA LANCETS 33G MISC  07/14/15   [provider]  [facility] VERIO test strip  07/14/15   [provider]  rosuvastatin (CRESTOR) 20 MG tablet Take 1 tablet (20 mg total) by mouth daily. Patient not taking: Reported on 11/09/2019 11/02/19   Maple Hudson., MD  TRESIBA FLEXTOUCH 200 UNIT/ML [facility]  07/14/15   [provider]    Allergies as of 11/09/2019  . (No Known Allergies)    Family History  Problem Relation Age of Onset  . Diverticulitis Mother   . Hypertension Father     Social History   Socioeconomic History  . Marital status: Married    Spouse name: Not on file  . Number of children: Not on file  . Years of education: Not on  file  . Highest education level: Not on file  Occupational History  . Not on file  Tobacco Use  . Smoking status: Never Smoker  . Smokeless tobacco: Never Used  Substance and Sexual Activity  . Alcohol use: No    Alcohol/week: 0.0 standard drinks  . Drug use: Not on file  . Sexual activity: Not on file  Other Topics Concern  . Not on file  Social History Narrative  . Not on file   Social Determinants of Health   Financial Resource Strain:   . Difficulty of Paying Living Expenses: Not on file  Food Insecurity:   . Worried About Programme researcher, broadcasting/film/video in the Last Year: Not on file  . Ran Out of Food in the Last Year: Not on file  Transportation Needs:   . Lack of Transportation (Medical): Not on file  . Lack of Transportation (Non-Medical): Not on file  Physical Activity:   . Days of Exercise per Week: Not on file  . Minutes of Exercise per Session: Not on file  Stress:   . Feeling of Stress : Not on file  Social Connections:   . Frequency of Communication with Friends and Family: Not on file  . Frequency of Social Gatherings with Friends and Family: Not on file  . Attends Religious Services: Not on file  .  Active Member of Clubs or Organizations: Not on file  . Attends Banker Meetings: Not on file  . Marital Status: Not on file  Intimate Partner Violence:   . Fear of Current or Ex-Partner: Not on file  . Emotionally Abused: Not on file  . Physically Abused: Not on file  . Sexually Abused: Not on file    Review of Systems: See HPI, otherwise negative ROS  Physical Exam: There were no vitals taken for this visit. General:   Alert,  pleasant and cooperative in NAD Head:  Normocephalic and atraumatic. Neck:  Supple; no masses or thyromegaly. Lungs:  Clear throughout to auscultation, normal respiratory effort.    Heart:  +S1, +S2, Regular rate and rhythm, No edema. Abdomen:  Soft, nontender and nondistended. Normal bowel sounds, without guarding, and  without rebound.   Neurologic:  Alert and  oriented x4;  grossly normal neurologically.  Impression/Plan: EDUIN FRIEDEL is here for an colonoscopy to be performed for Screening colonoscopy average risk   Risks, benefits, limitations, and alternatives regarding  colonoscopy have been reviewed with the patient.  Questions have been answered.  All parties agreeable.   [name], MD  12/06/2019, 9:34 AM

## 2019-12-06 NOTE — Anesthesia Postprocedure Evaluation (Signed)
Anesthesia Post Note  Patient: Roger Ruiz  Procedure(s) Performed: COLONOSCOPY WITH PROPOFOL (N/A )  Patient location during evaluation: Endoscopy Anesthesia Type: General Level of consciousness: awake and alert Pain management: pain level controlled Vital Signs Assessment: post-procedure vital signs reviewed and stable Respiratory status: spontaneous breathing, nonlabored ventilation, respiratory function stable and patient connected to nasal cannula oxygen Cardiovascular status: blood pressure returned to baseline and stable Postop Assessment: no apparent nausea or vomiting Anesthetic complications: no   No complications documented.   Last Vitals:  Vitals:   12/06/19 1007 12/06/19 1027  BP: 109/74 131/87  Pulse: 73 67  Resp: 17 (!) 8  Temp: (!) 36.1 C   SpO2: 99% 100%    Last Pain:  Vitals:   12/06/19 1027  TempSrc:   PainSc: 0-No pain                 Lenard Simmer

## 2019-12-06 NOTE — Transfer of Care (Signed)
Immediate Anesthesia Transfer of Care Note  Patient: Roger Ruiz  Procedure(s) Performed: COLONOSCOPY WITH PROPOFOL (N/A )  Patient Location: PACU and Endoscopy Unit  Anesthesia Type:General  Level of Consciousness: drowsy  Airway & Oxygen Therapy: Patient Spontanous Breathing  Post-op Assessment: Report given to RN  Post vital signs: stable  Last Vitals:  Vitals Value Taken Time  BP    Temp    Pulse    Resp    SpO2      Last Pain:  Vitals:   12/06/19 0940  TempSrc: Temporal  PainSc: 0-No pain         Complications: No complications documented.

## 2019-12-06 NOTE — Anesthesia Preprocedure Evaluation (Signed)
Anesthesia Evaluation  Patient identified by MRN, date of birth, ID band Patient awake    Reviewed: Allergy & Precautions, H&P , NPO status , Patient's Chart, lab work & pertinent test results, reviewed documented beta blocker date and time   History of Anesthesia Complications Negative for: history of anesthetic complications  Airway Mallampati: I  TM Distance: >3 FB Neck ROM: full    Dental  (+) Dental Advidsory Given, Teeth Intact   Pulmonary neg pulmonary ROS,    Pulmonary exam normal breath sounds clear to auscultation       Cardiovascular Exercise Tolerance: Good negative cardio ROS Normal cardiovascular exam Rhythm:regular Rate:Normal     Neuro/Psych negative neurological ROS  negative psych ROS   GI/Hepatic negative GI ROS, Neg liver ROS,   Endo/Other  diabetes  Renal/GU negative Renal ROS  negative genitourinary   Musculoskeletal   Abdominal   Peds  Hematology negative hematology ROS (+)   Anesthesia Other Findings Past Medical History: No date: Diabetes mellitus without complication (HCC)   Reproductive/Obstetrics negative OB ROS                             Anesthesia Physical Anesthesia Plan  ASA: II  Anesthesia Plan: General   Post-op Pain Management:    Induction: Intravenous  PONV Risk Score and Plan: 2 and Propofol infusion and TIVA  Airway Management Planned: Natural Airway and Nasal Cannula  Additional Equipment:   Intra-op Plan:   Post-operative Plan:   Informed Consent: I have reviewed the patients History and Physical, chart, labs and discussed the procedure including the risks, benefits and alternatives for the proposed anesthesia with the patient or authorized representative who has indicated his/her understanding and acceptance.     Dental Advisory Given  Plan Discussed with: Anesthesiologist, CRNA and Surgeon  Anesthesia Plan Comments:          Anesthesia Quick Evaluation

## 2019-12-06 NOTE — Op Note (Signed)
[facility] Patient Name: Roger Ruiz Procedure Date: 12/06/2019 9:56 AM MRN: [id number] Account #: [id number] Date of Birth: 1965/05/03 Admit Type: Outpatient Age: 54 Room: [facility] ENDO ROOM 1 Gender: Male Note Status: Finalized Procedure:             Colonoscopy Indications:           Screening for colorectal malignant neoplasm Providers:             [name] MD, MD Referring MD:          Ferdinand Lango. Sullivan Lone, MD (Referring MD) Medicines:             Monitored Anesthesia Care Complications:         No immediate complications. Procedure:             Pre-Anesthesia Assessment:                        - Prior to the procedure, a History and Physical was                         performed, and patient medications, allergies and                         sensitivities were reviewed. The patient's tolerance                         of previous anesthesia was reviewed.                        - The risks and benefits of the procedure and the                         sedation options and risks were discussed with the                         patient. All questions were answered and informed                         consent was obtained.                        - ASA Grade Assessment: II - A patient with mild                         systemic disease.                        After obtaining informed consent, the colonoscope was                         passed under direct vision. Throughout the procedure,                         the patient's blood pressure, pulse, and oxygen                         saturations were monitored continuously. The                         Colonoscope was introduced through the anus  with the                         intention of advancing to the cecum. The scope was                         advanced to the sigmoid colon before the procedure was                         aborted. Medications were given. The colonoscopy was                          performed with ease. The patient tolerated the                         procedure well. The quality of the bowel preparation                         was poor. Findings:      The perianal and digital rectal examinations were normal.      A large amount of stool was found in the rectum and in the sigmoid       colon, interfering with visualization. Impression:            - Preparation of the colon was poor.                        - Stool in the rectum and in the sigmoid colon.                        - No specimens collected. Recommendation:        - Discharge patient to home (with escort).                        - Resume previous diet.                        - Continue present medications.                        - Repeat colonoscopy in 2 weeks because the bowel                         preparation was suboptimal. Procedure Code(s):     --- Professional ---                        [id number], 53, Colonoscopy, flexible; diagnostic,                         including collection of specimen(s) by brushing or                         washing, when performed (separate procedure) Diagnosis Code(s):     --- Professional ---                        Z12.11, Encounter for screening for malignant neoplasm                         of  colon CPT copyright 2019 American Medical Association. All rights reserved. The codes documented in this report are preliminary and upon coder review may  be revised to meet current compliance requirements. [name], MD [name] MD, MD 12/06/2019 10:04:16 AM This report has been signed electronically. Number of Addenda: 0 Note Initiated On: 12/06/2019 9:56 AM Total Procedure Duration: 0 hours 0 minutes 57 seconds  Estimated Blood Loss:  Estimated blood loss: none.      [facility]

## 2019-12-07 ENCOUNTER — Encounter: Payer: Self-pay | Admitting: Gastroenterology

## 2019-12-20 ENCOUNTER — Telehealth: Payer: Self-pay

## 2019-12-20 NOTE — Telephone Encounter (Signed)
Called and left a message for call back to scheduled repeat colonoscopy due to Bowel preparation was suboptimals

## 2020-01-03 ENCOUNTER — Ambulatory Visit: Payer: Self-pay | Admitting: Family Medicine

## 2020-02-17 ENCOUNTER — Ambulatory Visit: Payer: Self-pay | Admitting: Podiatry

## 2020-02-24 ENCOUNTER — Encounter: Payer: Self-pay | Admitting: Podiatry

## 2020-02-24 ENCOUNTER — Ambulatory Visit: Payer: BC Managed Care – PPO | Admitting: Podiatry

## 2020-02-24 ENCOUNTER — Other Ambulatory Visit: Payer: Self-pay

## 2020-02-24 VITALS — BP 181/107 | HR 75 | Temp 98.4°F | Resp 16

## 2020-02-24 DIAGNOSIS — M79674 Pain in right toe(s): Secondary | ICD-10-CM | POA: Diagnosis not present

## 2020-02-24 DIAGNOSIS — B351 Tinea unguium: Secondary | ICD-10-CM | POA: Diagnosis not present

## 2020-02-24 DIAGNOSIS — M79675 Pain in left toe(s): Secondary | ICD-10-CM

## 2020-02-24 DIAGNOSIS — Z79899 Other long term (current) drug therapy: Secondary | ICD-10-CM | POA: Diagnosis not present

## 2020-02-24 MED ORDER — TERBINAFINE HCL 250 MG PO TABS: 250.0000 mg | ORAL_TABLET | Freq: Every day | ORAL | 0 refills | Status: DC

## 2020-02-24 NOTE — Addendum Note (Signed)
Addended by: Geraldine Contras D on: 02/24/2020 01:36 PM   Modules accepted: Orders

## 2020-02-24 NOTE — Progress Notes (Signed)
   HPI: 54 y.o. male presenting today as a new patient for evaluation of bilateral great toe pain.  Patient states that he has elongated thickened toenails to the bilateral great toes.  It has been like this for approximately 2 years.  It is hard to trim his own nails.  "They're thickened brittle and ugly".  He denies a history of injury he presents for further treatment evaluation  Past Medical History:  Diagnosis Date  . Diabetes mellitus without complication [facility])      Physical Exam: General: The patient is alert and oriented x3 in no acute distress.  Dermatology: Skin is warm, dry and supple bilateral lower extremities. Negative for open lesions or macerations.  Hyperkeratotic dystrophic thickened yellow discolored nails noted to the bilateral great toes  Vascular: Palpable pedal pulses bilaterally. No edema or erythema noted. Capillary refill within normal limits.  Neurological: Epicritic and protective threshold grossly intact bilaterally.   Musculoskeletal Exam: Range of motion within normal limits to all pedal and ankle joints bilateral. Muscle strength 5/5 in all groups bilateral.   Radiographic Exam:  Normal osseous mineralization. Joint spaces preserved. No fracture/dislocation/boney destruction.    Assessment: 1.  Onychomycosis of toenails bilateral great toes   Plan of Care:  1. Patient evaluated. X-Rays reviewed.  2.  Mechanical debridement of the bilateral great toenails was performed using a nail nipper without incident or bleeding 3.  Order placed for hepatic function panel 4.  Prescription for Lamisil turned 50 mg #90 daily 5.  Return to clinic as needed  *Patient comes in with his mother.  I treat his mother Mrs Denny Peon, DPM [facility]  Dr. Felecia Shelling, DPM    [address] [place], [place] [address]                Office [phone]  Fax [phone]

## 2020-02-25 LAB — HEPATIC FUNCTION PANEL
ALT: 15 IU/L (ref 0–44)
AST: 14 IU/L (ref 0–40)
Albumin: 4.5 g/dL (ref 3.8–4.9)
Alkaline Phosphatase: 61 IU/L (ref 44–121)
Bilirubin Total: 0.5 mg/dL (ref 0.0–1.2)
Bilirubin, Direct: 0.12 mg/dL (ref 0.00–0.40)
Total Protein: 6.9 g/dL (ref 6.0–8.5)

## 2020-11-23 ENCOUNTER — Other Ambulatory Visit: Payer: Self-pay

## 2020-11-23 ENCOUNTER — Ambulatory Visit: Payer: BC Managed Care – PPO | Admitting: Podiatry

## 2020-11-23 DIAGNOSIS — M79675 Pain in left toe(s): Secondary | ICD-10-CM

## 2020-11-23 DIAGNOSIS — M79674 Pain in right toe(s): Secondary | ICD-10-CM | POA: Diagnosis not present

## 2020-11-23 DIAGNOSIS — B351 Tinea unguium: Secondary | ICD-10-CM

## 2020-11-23 MED ORDER — TERBINAFINE HCL 250 MG PO TABS: 250.0000 mg | ORAL_TABLET | Freq: Every day | ORAL | 0 refills | Status: AC

## 2020-11-23 NOTE — Progress Notes (Signed)
   HPI: 55 y.o. male presenting today for follow-up evaluation of bilateral great toe pain.  Patient states that he has elongated thickened toenails to the bilateral great toes.  It has been like this for approximately 3 years.  It is hard to trim his own nails.  "They're thickened brittle and ugly".  Last visit he was prescribed oral Lamisil however he never got the prescription.  He would like to try the prescription today.  Patient also sustained a superficial abrasion overlying the left great toe secondary to shoes.  He says this been present for about 1 week.  Over the past week it has improved significantly.   Past Medical History:  Diagnosis Date   Diabetes mellitus without complication [facility])      Physical Exam: General: The patient is alert and oriented x3 in no acute distress.  Dermatology: Skin is warm, dry and supple bilateral lower extremities. Negative for open lesions or macerations.  Hyperkeratotic dystrophic thickened yellow discolored nails noted to the bilateral great toes  There is a superficial abrasion approximately 0.5 x 0.5 x 0.1 cm overlying the dorsal IPJ of the left great toe.  Patient states there is been significant improvement and has been present for about 1 week.  Vascular: Palpable pedal pulses bilaterally. No edema or erythema noted. Capillary refill within normal limits.  Neurological: Epicritic and protective threshold grossly intact bilaterally.   Musculoskeletal Exam: Range of motion within normal limits to all pedal and ankle joints bilateral. Muscle strength 5/5 in all groups bilateral.    Assessment: 1.  Onychomycosis of toenails bilateral great toes 2.  Diabetes mellitus; uncomplicated 3: Superficial abrasion dorsal aspect of the IPJ left great toe with good healing   Plan of Care:  1. Patient evaluated. X-Rays reviewed.  2.  Mechanical debridement of the bilateral great toenails was performed using a nail nipper without incident or  bleeding 3.  Patient's hepatic function panel is within normal limits at last office visit.  There has been no change in his medical history.   4.  Prescription for Lamisil turned 50 mg #90 daily.  Patient did not take his last Lamisil prescription 5.  Silvadene cream provided to apply to the superficial wound daily  6.  Return to clinic as needed  *Patient comes in with his mother.  I treat his mother Mrs Denny Peon, DPM [facility]  Dr. Felecia Shelling, DPM    [address] [place], [place] [address]                Office [phone]  Fax [phone]

## 2022-01-18 ENCOUNTER — Emergency Department
Admission: EM | Admit: 2022-01-18 | Discharge: 2022-01-18 | Disposition: A | Discharge status: Home / Self Care | Payer: BC Managed Care – PPO | Attending: Emergency Medicine | Admitting: Emergency Medicine

## 2022-01-18 ENCOUNTER — Emergency Department: Payer: BC Managed Care – PPO

## 2022-01-18 ENCOUNTER — Other Ambulatory Visit: Payer: Self-pay

## 2022-01-18 ENCOUNTER — Encounter: Payer: Self-pay | Admitting: Emergency Medicine

## 2022-01-18 DIAGNOSIS — W19XXXA Unspecified fall, initial encounter: Secondary | ICD-10-CM

## 2022-01-18 DIAGNOSIS — M546 Pain in thoracic spine: Secondary | ICD-10-CM | POA: Diagnosis present

## 2022-01-18 DIAGNOSIS — W010XXA Fall on same level from slipping, tripping and stumbling without subsequent striking against object, initial encounter: Secondary | ICD-10-CM | POA: Insufficient documentation

## 2022-01-18 MED ORDER — ONDANSETRON 4 MG PO TBDP
4.0000 mg | ORAL_TABLET | Freq: Once | ORAL | Status: AC
  Administered 2022-01-18: 4 mg via ORAL
  Filled 2022-01-18: qty 1

## 2022-01-18 MED ORDER — ONDANSETRON 4 MG PO TBDP: 4.0000 mg | ORAL_TABLET | Freq: Three times a day (TID) | ORAL | 0 refills | Status: AC | PRN

## 2022-01-18 MED ORDER — FENTANYL CITRATE PF 50 MCG/ML IJ SOSY
50.0000 ug | PREFILLED_SYRINGE | Freq: Once | INTRAMUSCULAR | Status: AC
  Administered 2022-01-18: 50 ug via INTRAMUSCULAR
  Filled 2022-01-18: qty 1

## 2022-01-18 MED ORDER — HYDROCODONE-ACETAMINOPHEN 5-325 MG PO TABS: 1.0000 | ORAL_TABLET | Freq: Four times a day (QID) | ORAL | 0 refills | Status: AC | PRN

## 2022-01-18 MED ORDER — OXYCODONE-ACETAMINOPHEN 5-325 MG PO TABS
1.0000 | ORAL_TABLET | Freq: Once | ORAL | Status: AC
  Administered 2022-01-18: 1 via ORAL
  Filled 2022-01-18: qty 1

## 2022-01-18 NOTE — ED Provider Notes (Signed)
[facility] Provider Note  Patient Contact: 9:10 PM (approximate)   History   Fall   HPI  Roger Ruiz is a 56 y.o. male presents to the emergency department with upper back pain after patient fell while trying to push a truck.  Patient states that he was trying to jump in the cab of his vehicle when he lost his balance and fell backwards.  He is unsure if he hit his head.  No numbness or tingling in the upper and lower extremities.  He states that pain does seem to radiate around to his chest.  He denies shortness of breath or chest tightness.  No nausea, vomiting or abdominal pain.      Physical Exam   Triage Vital Signs: ED Triage Vitals  Enc Vitals Group     BP 01/18/22 2019 (!) 154/82     Pulse Rate 01/18/22 2019 92     Resp 01/18/22 2019 18     Temp 01/18/22 2019 98.2 F (36.8 C)     Temp Source 01/18/22 2019 Oral     SpO2 01/18/22 2019 100 %     Weight 01/18/22 2019 192 lb (87.1 kg)     Height 01/18/22 2019 5\' 11"  (1.803 m)     Head Circumference --      Peak Flow --      Pain Score 01/18/22 2038 9     Pain Loc --      Pain Edu? --      Excl. in [place]? --     Most recent vital signs: Vitals:   01/18/22 2019  BP: (!) 154/82  Pulse: 92  Resp: 18  Temp: 98.2 F (36.8 C)  SpO2: 100%     General: Alert and in no acute distress. Eyes:  PERRL. EOMI. Head: No acute traumatic findings ENT:      Nose: No congestion/rhinnorhea.      Mouth/Throat: Mucous membranes are moist. Neck: No stridor. No cervical spine tenderness to palpation. Cardiovascular:  Good peripheral perfusion Respiratory: Normal respiratory effort without tachypnea or retractions. Lungs CTAB. Good air entry to the bases with no decreased or absent breath sounds. Gastrointestinal: Bowel sounds 4 quadrants. Soft and nontender to palpation. No guarding or rigidity. No palpable masses. No distention. No CVA tenderness. Musculoskeletal: Full range of motion to all  extremities.  Patient has midline thoracic spine tenderness to palpation. Neurologic:  No gross focal neurologic deficits are appreciated.  Skin:   No rash noted Other:   ED Results / Procedures / Treatments   Labs (all labs ordered are listed, but only abnormal results are displayed) Labs Reviewed - No data to display   EKG  Sinus tachycardia with no ST segment elevation or other apparent arrhythmia.   RADIOLOGY  I personally viewed and evaluated these images as part of my medical decision making, as well as reviewing the written report by the radiologist.  ED Provider Interpretation: No acute bony abnormality on CTs of the thoracic or lumbar spine.  No acute bony abnormality on CT of the cervical spine.  No evidence of intracranial bleed or skull fracture on head CT.  Chest x-ray unremarkable for acute abnormality.   PROCEDURES:  Critical Care performed: No  Procedures   MEDICATIONS ORDERED IN ED: Medications  oxyCODONE-acetaminophen (PERCOCET/ROXICET) 5-325 MG per tablet 1 tablet (has no administration in time range)  ondansetron (ZOFRAN-ODT) disintegrating tablet 4 mg (has no administration in time range)  fentaNYL (SUBLIMAZE) injection 50 mcg (  50 mcg Intramuscular Given 01/18/22 2112)  ondansetron (ZOFRAN-ODT) disintegrating tablet 4 mg (4 mg Oral Given 01/18/22 2113)     IMPRESSION / MDM / ASSESSMENT AND PLAN / ED COURSE  I reviewed the triage vital signs and the nursing notes.                              Assessment and plan:  Upper back pain:  56 year old male presents to the emergency department with upper back pain after mechanical fall.  Patient was hypertensive at triage but vital signs are reassuring.  On exam, patient was alert, active and nontoxic-appearing with no apparent neurodeficits.  He did have a midline thoracic spine tenderness.  CTs of the head showed no evidence of intracranial bleed or skull fracture.  CTs of the thoracic and lumbar spine  showed no acute bony abnormality.  CT of the cervical spine shows no acute bony abnormality.  Chest x-ray unremarkable.  Patient was prescribed a short course of Norco for pain.  Return precautions were given to return with new or worsening symptoms. Clinical Course as of 01/18/22 2238  Sat Jan 18, 2022  2224 CT Head Wo Contrast [JW]    Clinical Course User Index [JW] Orvil Feil, [place]     FINAL CLINICAL IMPRESSION(S) / ED DIAGNOSES   Final diagnoses:  Fall, initial encounter     Rx / DC Orders   ED Discharge Orders          Ordered    HYDROcodone-acetaminophen (NORCO) 5-325 MG tablet  Every 6 hours PRN        01/18/22 2233    ondansetron (ZOFRAN-ODT) 4 MG disintegrating tablet  Every 8 hours PRN        01/18/22 2233             Note:  This document was prepared using Dragon voice recognition software and may include unintentional dictation errors.   Pia Mau [place], Cordelia Poche 01/18/22 2239    Sharman Cheek, MD 01/19/22 [phone]

## 2022-01-18 NOTE — ED Notes (Signed)
Pt taken to CT.

## 2022-01-18 NOTE — ED Triage Notes (Signed)
Pt reports he slip and fell on his left side. Denies any hip pain reports mid upped back pain. Pt denies hitting his head. Pt denies taking any blood thinners. Pt talks in complete sentences no distress noted
